# Patient Record
Sex: Male | Born: 1960 | Race: White | Hispanic: No | Marital: Married | Smoking: Light tobacco smoker
Health system: Southern US, Community
[De-identification: ages and names within clinical notes are randomized; demographics above are authoritative.]

## PROBLEM LIST (undated history)

## (undated) DIAGNOSIS — G709 Myoneural disorder, unspecified: Secondary | ICD-10-CM

## (undated) DIAGNOSIS — R200 Anesthesia of skin: Secondary | ICD-10-CM

## (undated) DIAGNOSIS — I10 Essential (primary) hypertension: Secondary | ICD-10-CM

## (undated) DIAGNOSIS — K219 Gastro-esophageal reflux disease without esophagitis: Secondary | ICD-10-CM

## (undated) DIAGNOSIS — R569 Unspecified convulsions: Secondary | ICD-10-CM

## (undated) DIAGNOSIS — E7800 Pure hypercholesterolemia, unspecified: Secondary | ICD-10-CM

## (undated) DIAGNOSIS — R202 Paresthesia of skin: Secondary | ICD-10-CM

## (undated) DIAGNOSIS — J45909 Unspecified asthma, uncomplicated: Secondary | ICD-10-CM

## (undated) DIAGNOSIS — M1990 Unspecified osteoarthritis, unspecified site: Secondary | ICD-10-CM

## (undated) HISTORY — PX: COLONOSCOPY: SHX174

---

## 2013-09-22 ENCOUNTER — Other Ambulatory Visit: Payer: Self-pay | Admitting: Neurosurgery

## 2013-10-24 ENCOUNTER — Encounter (HOSPITAL_COMMUNITY): Payer: Self-pay | Admitting: Pharmacy Technician

## 2013-10-30 ENCOUNTER — Other Ambulatory Visit (HOSPITAL_COMMUNITY): Payer: Self-pay

## 2013-10-31 ENCOUNTER — Encounter (HOSPITAL_COMMUNITY): Payer: Self-pay

## 2013-10-31 ENCOUNTER — Encounter (HOSPITAL_COMMUNITY)
Admission: RE | Admit: 2013-10-31 | Discharge: 2013-10-31 | Disposition: A | Discharge status: Home / Self Care | Payer: BC Managed Care – PPO | Source: Ambulatory Visit | Attending: Neurosurgery | Admitting: Neurosurgery

## 2013-10-31 ENCOUNTER — Encounter (INDEPENDENT_AMBULATORY_CARE_PROVIDER_SITE_OTHER): Payer: Self-pay

## 2013-10-31 ENCOUNTER — Ambulatory Visit (HOSPITAL_COMMUNITY)
Admission: RE | Admit: 2013-10-31 | Discharge: 2013-10-31 | Disposition: A | Discharge status: Home / Self Care | Payer: BC Managed Care – PPO | Source: Ambulatory Visit | Attending: Anesthesiology | Admitting: Anesthesiology

## 2013-10-31 ENCOUNTER — Other Ambulatory Visit: Payer: Self-pay

## 2013-10-31 DIAGNOSIS — Z01818 Encounter for other preprocedural examination: Secondary | ICD-10-CM | POA: Insufficient documentation

## 2013-10-31 HISTORY — DX: Essential (primary) hypertension: I10

## 2013-10-31 HISTORY — DX: Gastro-esophageal reflux disease without esophagitis: K21.9

## 2013-10-31 HISTORY — DX: Unspecified convulsions: R56.9

## 2013-10-31 HISTORY — DX: Unspecified osteoarthritis, unspecified site: M19.90

## 2013-10-31 HISTORY — DX: Pure hypercholesterolemia, unspecified: E78.00

## 2013-10-31 HISTORY — DX: Paresthesia of skin: R20.2

## 2013-10-31 HISTORY — DX: Unspecified asthma, uncomplicated: J45.909

## 2013-10-31 HISTORY — DX: Anesthesia of skin: R20.0

## 2013-10-31 LAB — BASIC METABOLIC PANEL
Anion gap: 12 (ref 5–15)
BUN: 16 mg/dL (ref 6–23)
CO2: 24 meq/L (ref 19–32)
Calcium: 9.1 mg/dL (ref 8.4–10.5)
Chloride: 103 mEq/L (ref 96–112)
Creatinine, Ser: 0.73 mg/dL (ref 0.50–1.35)
GFR calc Af Amer: >90 mL/min (ref >90)
GFR calc non Af Amer: >90 mL/min (ref >90)
GLUCOSE: 151 mg/dL — AB (ref 70–99)
POTASSIUM: 4.9 meq/L (ref 3.7–5.3)
Sodium: 139 mEq/L (ref 137–147)

## 2013-10-31 LAB — CBC
HEMATOCRIT: 41 % (ref 39.0–52.0)
HEMOGLOBIN: 13.8 g/dL (ref 13.0–17.0)
MCH: 30.9 pg (ref 26.0–34.0)
MCHC: 33.7 g/dL (ref 30.0–36.0)
MCV: 91.9 fL (ref 78.0–100.0)
PLATELETS: 249 10*3/uL (ref 150–400)
RBC: 4.46 MIL/uL (ref 4.22–5.81)
RDW: 12.9 % (ref 11.5–15.5)
WBC: 7 10*3/uL (ref 4.0–10.5)

## 2013-10-31 LAB — TYPE AND SCREEN
ABO/RH(D): A POS
Antibody Screen: NEGATIVE

## 2013-10-31 LAB — ABO/RH: ABO/RH(D): A POS

## 2013-10-31 LAB — SURGICAL PCR SCREEN
MRSA, PCR: NEGATIVE
Staphylococcus aureus: NEGATIVE

## 2013-10-31 NOTE — Pre-Procedure Instructions (Signed)
Cody Park  10/31/2013   Your procedure is scheduled on: Tuesday, November 04, 2013 at 9:00 AM  [facility] to [facility] Admitting at 6:00 AM (per MD)  Call this number if you have problems the morning of surgery: [phone]   Remember:   Do not eat food or drink liquids after midnight Monday, November 03, 2013   Take these medicines the morning of surgery with A SIP OF WATER: carbamazepine (TEGRETOL),   fexofenadine (ALLEGRA), if needed: pain medication Stop taking Aspirin, vitamins, and herbal medications. Do not take any NSAIDs ie: Ibuprofen, Advil, Naproxen or any medication containing Aspirin.  Do not wear jewelry, make-up or nail polish.  Do not wear lotions, powders, or perfumes. You may wear deodorant.  Do not shave 48 hours prior to surgery. Men may shave face and neck.  Do not bring valuables to the hospital.  [facility] is not responsible for any belongings or valuables.               Contacts, dentures or bridgework may not be worn into surgery.  Leave suitcase in the car. After surgery it may be brought to your room.  For patients admitted to the hospital, discharge time is determined by your treatment team.               Patients discharged the day of surgery will not be allowed to drive home.  Name and phone number of your driver:   Special Instructions:  Special Instructions:Special Instructions: [facility] - Preparing for Surgery  Before surgery, you can play an important role.  Because skin is not sterile, your skin needs to be as free of germs as possible.  You can reduce the number of germs on you skin by washing with CHG (chlorahexidine gluconate) soap before surgery.  CHG is an antiseptic cleaner which kills germs and bonds with the skin to continue killing germs even after washing.  Please DO NOT use if you have an allergy to CHG or antibacterial soaps.  If your skin becomes reddened/irritated stop using the CHG and inform your nurse when you arrive at  Short Stay.  Do not shave (including legs and underarms) for at least 48 hours prior to the first CHG shower.  You may shave your face.  Please follow these instructions carefully:   1.  Shower with CHG Soap the night before surgery and the morning of Surgery.  2.  If you choose to wash your hair, wash your hair first as usual with your normal shampoo.  3.  After you shampoo, rinse your hair and body thoroughly to remove the Shampoo.  4.  Use CHG as you would any other liquid soap.  You can apply chg directly  to the skin and wash gently with scrungie or a clean washcloth.  5.  Apply the CHG Soap to your body ONLY FROM THE NECK DOWN.  Do not use on open wounds or open sores.  Avoid contact with your eyes, ears, mouth and genitals (private parts).  Wash genitals (private parts) with your normal soap.  6.  Wash thoroughly, paying special attention to the area where your surgery will be performed.  7.  Thoroughly rinse your body with warm water from the neck down.  8.  DO NOT shower/wash with your normal soap after using and rinsing off the CHG Soap.  9.  Pat yourself dry with a clean towel.  10.  Wear clean pajamas.            11.  Place clean sheets on your bed the night of your first shower and do not sleep with pets.  Day of Surgery  Do not apply any lotions the morning of surgery.  Please wear clean clothes to the hospital/surgery center.   Please read over the following fact sheets that you were given: Pain Booklet, Coughing and Deep Breathing, Blood Transfusion Information, MRSA Information and Surgical Site Infection Prevention

## 2013-10-31 NOTE — Progress Notes (Signed)
Pt denies SOB, chest pain, and being under the care of a cardiologist. Pt denies having a chest x ray and EKG within the last year; pt denies having a stress test, echo, and cardiac cath. Pt stated that his last seizure was in 1991 when MD discontinued his seizure medicine.Pt has not had a seizure since going back on seizure medications. [place]  ( [place]) to review abnormal EKG.

## 2013-10-31 NOTE — Progress Notes (Signed)
10/31/13 1543  OBSTRUCTIVE SLEEP APNEA  Have you ever been diagnosed with sleep apnea through a sleep study? No  Do you snore loudly (loud enough to be heard through closed doors)?  0  Do you often feel tired, fatigued, or sleepy during the daytime? 0  Has anyone observed you stop breathing during your sleep? 0  Do you have, or are you being treated for high blood pressure? 1  BMI more than 35 kg/m2? 1  Age over 51 years old? 1  Neck circumference greater than 40 cm/16 inches? 1  Gender: 1  Obstructive Sleep Apnea Score 5

## 2013-11-03 ENCOUNTER — Telehealth: Payer: Self-pay

## 2013-11-03 ENCOUNTER — Encounter: Payer: Self-pay | Admitting: Cardiovascular Disease

## 2013-11-03 ENCOUNTER — Ambulatory Visit (INDEPENDENT_AMBULATORY_CARE_PROVIDER_SITE_OTHER): Payer: BC Managed Care – PPO | Admitting: Cardiovascular Disease

## 2013-11-03 ENCOUNTER — Ambulatory Visit (HOSPITAL_COMMUNITY)
Admission: RE | Admit: 2013-11-03 | Discharge: 2013-11-03 | Disposition: A | Discharge status: Home / Self Care | Payer: BC Managed Care – PPO | Source: Ambulatory Visit | Attending: Cardiovascular Disease | Admitting: Cardiovascular Disease

## 2013-11-03 VITALS — BP 122/78 | HR 99 | Ht 70.0 in | Wt 264.0 lb

## 2013-11-03 DIAGNOSIS — R9431 Abnormal electrocardiogram [ECG] [EKG]: Secondary | ICD-10-CM

## 2013-11-03 DIAGNOSIS — E785 Hyperlipidemia, unspecified: Secondary | ICD-10-CM

## 2013-11-03 DIAGNOSIS — I517 Cardiomegaly: Secondary | ICD-10-CM

## 2013-11-03 DIAGNOSIS — E669 Obesity, unspecified: Secondary | ICD-10-CM

## 2013-11-03 DIAGNOSIS — I10 Essential (primary) hypertension: Secondary | ICD-10-CM

## 2013-11-03 DIAGNOSIS — Z01818 Encounter for other preprocedural examination: Secondary | ICD-10-CM

## 2013-11-03 MED ORDER — DEXTROSE 5 % IV SOLN
3.0000 g | INTRAVENOUS | Status: AC
  Administered 2013-11-04: 3 g via INTRAVENOUS
  Filled 2013-11-03: qty 3000

## 2013-11-03 NOTE — Telephone Encounter (Signed)
Faxed echo report to Dr.Stern [phone]

## 2013-11-03 NOTE — Progress Notes (Addendum)
Patient ID: Cody Park, male   DOB: 02-Jun-1960, 53 y.o.   MRN: [id number]       CARDIOLOGY CONSULT NOTE  Patient ID: Cody Park MRN: [id number] DOB/AGE: 02-Jun-1960 53 y.o.  Admit date: (Not on file) Primary Physician Glenis SmokerJaswani, Sanjay M, MD  Reason for Consultation: abnormal ECG, preop risk stratification  HPI: The patient is a 53 yr old male with a PMH significant for seizures, essential HTN, obesity, and hyperlipidemia who is being scheduled to undergo L4-5 MAS, PLIF on 11/04/13 by Dr. Venetia MaxonStern. He is here with his wife, Bonita QuinLinda. He patient denies any symptoms of chest pain, palpitations, shortness of breath, lightheadedness, leg swelling, orthopnea, PND, and syncope. He has noticed some episodic dizziness after undergoing an epidural. His primary complaint is right leg weakness leading to diminished activity levels resulting in a 30 lb weight gain in the last several months. He smokes 2 cigars per year. He has no family history of premature CAD. 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 He has never undergone any noninvasive cardiovascular testing in the past.    Allergies  Allergen Reactions  . Codeine Other (See Comments)    Childhood allergy, reaction unknown.  . Shellfish Allergy Other (See Comments)    AGENT:  Only some shellfish.   REACTION: swelling    Current Outpatient Prescriptions  Medication Sig Dispense Refill  . acetaminophen (TYLENOL) 500 MG tablet Take 1,000 mg by mouth 2 (two) times daily as needed for mild pain.      Marland Kitchen. aspirin EC 81 MG tablet Take 81 mg by mouth daily.      . carbamazepine (TEGRETOL) 200 MG tablet Take 300 mg by mouth 2 (two) times daily.      . cyclobenzaprine (FLEXERIL) 10 MG tablet Take 10 mg by mouth daily as needed for muscle spasms.      Marland Kitchen. docusate sodium (COLACE) 100 MG capsule Take 100 mg by mouth every morning.      .  enalapril (VASOTEC) 5 MG tablet Take 5 mg by mouth daily.      . fexofenadine (ALLEGRA) 180 MG tablet Take 180 mg by mouth daily.      Marland Kitchen. HYDROcodone-acetaminophen (NORCO/VICODIN) 5-325 MG per tablet Take 1 tablet by mouth every 6 (six) hours as needed for moderate pain.      Marland Kitchen. ibuprofen (ADVIL,MOTRIN) 200 MG tablet Take 400 mg by mouth 2 (two) times daily as needed for mild pain.      Marland Kitchen. oxyCODONE-acetaminophen (PERCOCET/ROXICET) 5-325 MG per tablet Take 1 tablet by mouth every 6 (six) hours as needed for severe pain.      . pravastatin (PRAVACHOL) 20 MG tablet Take 20 mg by mouth daily.       No current facility-administered medications for this visit.   Facility-Administered Medications Ordered in Other Visits  Medication Dose Route Frequency Provider Last Rate Last Dose  . [START ON 11/04/2013] ceFAZolin (ANCEF) 3 g in dextrose 5 % 50 mL IVPB  3 g Intravenous On Call to OR Maeola HarmanJoseph Stern, MD        Past Medical History  Diagnosis Date  . Hypertension   . Hypercholesterolemia   . Asthma     as a child  . Seizures   . Numbness and tingling of right leg   . GERD (gastroesophageal reflux disease)   . Arthritis     Past Surgical History  Procedure Laterality Date  .  Colonoscopy      History   Social History  . Marital Status: Married    Spouse Name: N/A    Number of Children: N/A  . Years of Education: N/A   Occupational History  . Not on file.   Social History Main Topics  . Smoking status: Light Tobacco Smoker    Types: Cigars  . Smokeless tobacco: Never Used  . Alcohol Use: Yes     Comment: social ' beer mostly"  . Drug Use: No  . Sexual Activity: Not on file   Other Topics Concern  . Not on file   Social History Narrative  . No narrative on file     No family history of premature CAD in 1st degree relatives.  Prior to Admission medications   Medication Sig Start Date End Date Taking? Authorizing Provider  acetaminophen (TYLENOL) 500 MG tablet Take 1,000 mg  by mouth 2 (two) times daily as needed for mild pain.   Yes Historical Provider, MD  aspirin EC 81 MG tablet Take 81 mg by mouth daily.   Yes Historical Provider, MD  carbamazepine (TEGRETOL) 200 MG tablet Take 300 mg by mouth 2 (two) times daily.   Yes Historical Provider, MD  cyclobenzaprine (FLEXERIL) 10 MG tablet Take 10 mg by mouth daily as needed for muscle spasms.   Yes Historical Provider, MD  docusate sodium (COLACE) 100 MG capsule Take 100 mg by mouth every morning.   Yes Historical Provider, MD  enalapril (VASOTEC) 5 MG tablet Take 5 mg by mouth daily.   Yes Historical Provider, MD  fexofenadine (ALLEGRA) 180 MG tablet Take 180 mg by mouth daily.   Yes Historical Provider, MD  HYDROcodone-acetaminophen (NORCO/VICODIN) 5-325 MG per tablet Take 1 tablet by mouth every 6 (six) hours as needed for moderate pain.   Yes Historical Provider, MD  ibuprofen (ADVIL,MOTRIN) 200 MG tablet Take 400 mg by mouth 2 (two) times daily as needed for mild pain.   Yes Historical Provider, MD  oxyCODONE-acetaminophen (PERCOCET/ROXICET) 5-325 MG per tablet Take 1 tablet by mouth every 6 (six) hours as needed for severe pain.   Yes Historical Provider, MD  pravastatin (PRAVACHOL) 20 MG tablet Take 20 mg by mouth daily.   Yes Historical Provider, MD     Review of systems complete and found to be negative unless listed above in HPI     Physical exam Blood pressure 122/78, pulse 99, height 5\' 10"  (1.778 m), weight 264 lb (119.75 kg), SpO2 95.00%. General: NAD, obese Neck: No JVD, no thyromegaly or thyroid nodule.  Lungs: Clear to auscultation bilaterally with normal respiratory effort. CV: Nondisplaced PMI. Regular rate and rhythm, normal S1/S2, no S3/S4, no murmur.  No peripheral edema.  No carotid bruit.  Normal pedal pulses.  Abdomen: Soft, nontender, obese, no distention.  Skin: Intact without lesions or rashes.  Neurologic: Alert and oriented x 3.  Psych: Normal affect. Extremities: No clubbing or  cyanosis.  HEENT: Normal.   Labs:   Lab Results  Component Value Date   WBC 7.0 10/31/2013   HGB 13.8 10/31/2013   HCT 41.0 10/31/2013   MCV 91.9 10/31/2013   PLT 249 10/31/2013    Recent Labs Lab 10/31/13 1604  NA 139  K 4.9  CL 103  CO2 24  BUN 16  CREATININE 0.73  CALCIUM 9.1  GLUCOSE 151*   No results found for this basename: CKTOTAL, CKMB, CKMBINDEX, TROPONINI    No results found for this basename: CHOL  No results found for this basename: HDL   No results found for this basename: LDLCALC   No results found for this basename: TRIG   No results found for this basename: CHOLHDL   No results found for this basename: LDLDIRECT         Studies: No results found.  ASSESSMENT AND PLAN:  1. Preoperative risk stratification/abnormal ECG: The patient denies any symptoms of cardiovascular disease, specifically chest pain, palpitations, and exertional dyspnea. His abnormal ECG may be demonstrating Q waves which are falsely positive. He does have risk factors for coronary artery disease which include essential HTN, hyperlipidemia, and obesity. I will obtain an echocardiogram to assess left ventricular systolic function and regional wall motion, paying particular attention to the inferior wall. I do not feel he necessitates preoperative stress testing given his absence of symptoms. If the echo is normal, I think he would be at low risk for a major adverse perioperative cardiac event.  2. Essential hypertension: Controlled on enalapril.  3. Hyperlipidemia: On pravastatin.  Dispo: f/u prn.   Signed: Prentice Docker, M.D., F.A.C.C.  11/03/2013, 3:13 PM  ADDENDUM: Upon preliminary review of the echocardiographic images, left ventricular systolic function is normal, estimated EF 60-65%, with mild concentric left ventricular hypertrophy and normal regional wall motion. Specifically, the inferior wall moves well. Full report to follow. He should be at a low perioperative  risk for a major adverse cardiac event. Optimal BP control should be emphasized.

## 2013-11-03 NOTE — Progress Notes (Signed)
*   Echocardiogram 2D Echocardiogram has been performed.  Auri Jahnke 11/03/2013, 4:28 PM

## 2013-11-03 NOTE — Progress Notes (Signed)
Anesthesia Chart Review:  Patient is a 53 year old male scheduled for L4-5 MAS, PLIF on 11/04/13 by Dr. Venetia MaxonStern.  History includes smoking, HTN, hypercholesterolemia, GERD, arthritis, childhood asthma, seizures (on Tegretol), RLE paresthesias. BMI is 38 consistent with obesity.  OSA screening score was 5. PCP is listed as Dr. Donnetta HutchingSanjay Jaswani.  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 There is no comparison EKG in [facility], [facility], [facility], or at his PCP office.  Preoperative chest x-ray and labs noted.  Earlier today I reviewed above with anesthesiologist Dr. Noreene LarssonJoslin who recommended a preoperative cardiology evaluation due to inferior Q waves with CAD risk factors.  Dr. Purvis SheffieldKoneswaran with [facility] was able to work him in this afternoon. According to his note: "1. Preoperative risk stratification/abnormal ECG: The patient denies any symptoms of cardiovascular disease, specifically chest pain, palpitations, and exertional dyspnea. His abnormal ECG may be demonstrating Q waves which are falsely positive. He does have risk factors for coronary artery disease which include essential HTN, hyperlipidemia, and obesity. I will obtain an echocardiogram to assess left ventricular systolic function and regional wall motion, paying particular attention to the inferior wall. I do not feel he necessitates preoperative stress testing given his absence of symptoms. If the echo is normal, I think he would be at low risk for a major adverse perioperative cardiac event." PRN cardiology follow-up recommended.  Echo today showed:  - Procedure narrative: Transthoracic echocardiography. Image quality was suboptimal. The study was technically difficult, as a result of poor sound wave transmission and body habitus. - Left ventricle: The cavity size was normal. Wall thickness was increased in a pattern of mild LVH. Systolic function was vigorous. The estimated ejection fraction was in the range of 65%  to 70%. Wall motion was normal; there were no regional wall motion abnormalities. There was an increased relative contribution of atrial contraction to ventricular filling. Doppler parameters are consistent with abnormal left ventricular relaxation (grade 1 diastolic dysfunction). - Left atrium: The atrium was mildly dilated.  LVEF was NL with no regional wall motion abnormalities, so subsequently, I think he will be able to proceed as planned based on cardiology recommendations.  Velna Ochsllison Jaykob Minichiello, PA-C [facility]/Anesthesiology Phone [phone] 11/03/2013 5:51 PM

## 2013-11-03 NOTE — Patient Instructions (Addendum)
Your physician recommends that you schedule a follow-up appointment in: to be determined after echo     Your physician has requested that you have an echocardiogram NOW. Echocardiography is a painless test that uses sound waves to create images of your heart. It provides your doctor with information about the size and shape of your heart and how well your heart's chambers and valves are working. This procedure takes approximately one hour. There are no restrictions for this procedure.       Thank you for choosing [facility] !

## 2013-11-03 NOTE — Telephone Encounter (Signed)
[facility]Stern at [facility] [phone] preop risk stratification/cardiac consultation note from today

## 2013-11-04 ENCOUNTER — Encounter (HOSPITAL_COMMUNITY): Payer: Self-pay | Admitting: *Deleted

## 2013-11-04 ENCOUNTER — Inpatient Hospital Stay (HOSPITAL_COMMUNITY)
Admission: RE | Admit: 2013-11-04 | Discharge: 2013-11-05 | DRG: 460 | Disposition: A | Discharge status: Home / Self Care | Payer: BC Managed Care – PPO | Source: Ambulatory Visit | Attending: Neurosurgery | Admitting: Neurosurgery

## 2013-11-04 ENCOUNTER — Encounter (HOSPITAL_COMMUNITY): Payer: BC Managed Care – PPO | Admitting: Vascular Surgery

## 2013-11-04 ENCOUNTER — Inpatient Hospital Stay (HOSPITAL_COMMUNITY): Payer: BC Managed Care – PPO | Admitting: Anesthesiology

## 2013-11-04 ENCOUNTER — Encounter (HOSPITAL_COMMUNITY)
Admission: RE | Disposition: A | Discharge status: Home / Self Care | Payer: Self-pay | Source: Ambulatory Visit | Attending: Neurosurgery

## 2013-11-04 ENCOUNTER — Inpatient Hospital Stay (HOSPITAL_COMMUNITY): Payer: BC Managed Care – PPO

## 2013-11-04 DIAGNOSIS — M48061 Spinal stenosis, lumbar region without neurogenic claudication: Principal | ICD-10-CM | POA: Diagnosis present

## 2013-11-04 DIAGNOSIS — M4316 Spondylolisthesis, lumbar region: Secondary | ICD-10-CM | POA: Diagnosis present

## 2013-11-04 DIAGNOSIS — I10 Essential (primary) hypertension: Secondary | ICD-10-CM | POA: Diagnosis present

## 2013-11-04 DIAGNOSIS — M7130 Other bursal cyst, unspecified site: Secondary | ICD-10-CM | POA: Diagnosis present

## 2013-11-04 HISTORY — PX: MAXIMUM ACCESS (MAS)POSTERIOR LUMBAR INTERBODY FUSION (PLIF) 1 LEVEL: SHX6368

## 2013-11-04 SURGERY — FOR MAXIMUM ACCESS (MAS) POSTERIOR LUMBAR INTERBODY FUSION (PLIF) 1 LEVEL
Anesthesia: General | Site: Back

## 2013-11-04 MED ORDER — DOCUSATE SODIUM 100 MG PO CAPS
100.0000 mg | ORAL_CAPSULE | Freq: Every day | ORAL | Status: DC
  Filled 2013-11-04: qty 1

## 2013-11-04 MED ORDER — ACETAMINOPHEN 650 MG RE SUPP: 650.0000 mg | RECTAL | Status: DC | PRN

## 2013-11-04 MED ORDER — LACTATED RINGERS IV SOLN
INTRAVENOUS | Status: DC
  Administered 2013-11-04 (×2): via INTRAVENOUS

## 2013-11-04 MED ORDER — HYDROMORPHONE HCL PF 1 MG/ML IJ SOLN
0.2500 mg | INTRAMUSCULAR | Status: DC | PRN
  Administered 2013-11-04 (×3): 0.5 mg via INTRAVENOUS

## 2013-11-04 MED ORDER — ACETAMINOPHEN 325 MG PO TABS: 650.0000 mg | ORAL_TABLET | ORAL | Status: DC | PRN

## 2013-11-04 MED ORDER — ROCURONIUM BROMIDE 50 MG/5ML IV SOLN
INTRAVENOUS | Status: AC
  Filled 2013-11-04: qty 1

## 2013-11-04 MED ORDER — ONDANSETRON HCL 4 MG/2ML IJ SOLN: 4.0000 mg | Freq: Four times a day (QID) | INTRAMUSCULAR | Status: DC | PRN

## 2013-11-04 MED ORDER — OXYCODONE HCL 5 MG/5ML PO SOLN: 5.0000 mg | Freq: Once | ORAL | Status: AC | PRN

## 2013-11-04 MED ORDER — FENTANYL CITRATE 0.05 MG/ML IJ SOLN
INTRAMUSCULAR | Status: DC | PRN
  Administered 2013-11-04: 150 ug via INTRAVENOUS
  Administered 2013-11-04 (×2): 50 ug via INTRAVENOUS

## 2013-11-04 MED ORDER — ENALAPRIL MALEATE 5 MG PO TABS
5.0000 mg | ORAL_TABLET | Freq: Every day | ORAL | Status: DC
  Administered 2013-11-04 – 2013-11-05 (×2): 5 mg via ORAL
  Filled 2013-11-04 (×2): qty 1

## 2013-11-04 MED ORDER — OXYCODONE-ACETAMINOPHEN 5-325 MG PO TABS: 1.0000 | ORAL_TABLET | Freq: Four times a day (QID) | ORAL | Status: DC | PRN

## 2013-11-04 MED ORDER — MIDAZOLAM HCL 5 MG/5ML IJ SOLN
INTRAMUSCULAR | Status: DC | PRN
  Administered 2013-11-04: 2 mg via INTRAVENOUS

## 2013-11-04 MED ORDER — MORPHINE SULFATE 2 MG/ML IJ SOLN: 1.0000 mg | INTRAMUSCULAR | Status: DC | PRN

## 2013-11-04 MED ORDER — ONDANSETRON HCL 4 MG/2ML IJ SOLN: 4.0000 mg | INTRAMUSCULAR | Status: DC | PRN

## 2013-11-04 MED ORDER — ZOLPIDEM TARTRATE 5 MG PO TABS: 5.0000 mg | ORAL_TABLET | Freq: Every evening | ORAL | Status: DC | PRN

## 2013-11-04 MED ORDER — OXYCODONE HCL 5 MG PO TABS
5.0000 mg | ORAL_TABLET | Freq: Once | ORAL | Status: AC | PRN
  Administered 2013-11-04: 5 mg via ORAL

## 2013-11-04 MED ORDER — SODIUM CHLORIDE 0.9 % IJ SOLN
3.0000 mL | Freq: Two times a day (BID) | INTRAMUSCULAR | Status: DC
  Administered 2013-11-04 – 2013-11-05 (×3): 3 mL via INTRAVENOUS

## 2013-11-04 MED ORDER — OXYCODONE-ACETAMINOPHEN 5-325 MG PO TABS
1.0000 | ORAL_TABLET | ORAL | Status: DC | PRN
Start: 2013-11-04 — End: 2013-11-05
  Administered 2013-11-04 – 2013-11-05 (×5): 2 via ORAL
  Filled 2013-11-04 (×5): qty 2

## 2013-11-04 MED ORDER — DOCUSATE SODIUM 100 MG PO CAPS
100.0000 mg | ORAL_CAPSULE | Freq: Two times a day (BID) | ORAL | Status: DC
  Administered 2013-11-04 – 2013-11-05 (×3): 100 mg via ORAL
  Filled 2013-11-04 (×3): qty 1

## 2013-11-04 MED ORDER — DIAZEPAM 5 MG PO TABS
5.0000 mg | ORAL_TABLET | Freq: Four times a day (QID) | ORAL | Status: DC | PRN
  Administered 2013-11-04 – 2013-11-05 (×2): 5 mg via ORAL
  Filled 2013-11-04: qty 1

## 2013-11-04 MED ORDER — LIDOCAINE HCL (CARDIAC) 20 MG/ML IV SOLN
INTRAVENOUS | Status: DC | PRN
  Administered 2013-11-04: 80 mg via INTRAVENOUS

## 2013-11-04 MED ORDER — HYDROMORPHONE HCL PF 1 MG/ML IJ SOLN
INTRAMUSCULAR | Status: AC
  Filled 2013-11-04: qty 1

## 2013-11-04 MED ORDER — BUPIVACAINE LIPOSOME 1.3 % IJ SUSP
20.0000 mL | INTRAMUSCULAR | Status: AC
  Administered 2013-11-04: 20 mL
  Filled 2013-11-04: qty 20

## 2013-11-04 MED ORDER — OXYCODONE HCL 5 MG PO TABS
ORAL_TABLET | ORAL | Status: AC
  Filled 2013-11-04: qty 1

## 2013-11-04 MED ORDER — HYDROCODONE-ACETAMINOPHEN 5-325 MG PO TABS: 1.0000 | ORAL_TABLET | Freq: Four times a day (QID) | ORAL | Status: DC | PRN

## 2013-11-04 MED ORDER — PROPOFOL 10 MG/ML IV BOLUS
INTRAVENOUS | Status: AC
  Filled 2013-11-04: qty 20

## 2013-11-04 MED ORDER — ASPIRIN EC 81 MG PO TBEC
81.0000 mg | DELAYED_RELEASE_TABLET | Freq: Every day | ORAL | Status: DC
  Administered 2013-11-04 – 2013-11-05 (×2): 81 mg via ORAL
  Filled 2013-11-04 (×2): qty 1

## 2013-11-04 MED ORDER — DEXTROSE 5 % IV SOLN: 3.0000 g | Freq: Three times a day (TID) | INTRAVENOUS | Status: DC

## 2013-11-04 MED ORDER — PROPOFOL 10 MG/ML IV BOLUS
INTRAVENOUS | Status: DC | PRN
  Administered 2013-11-04: 200 mg via INTRAVENOUS

## 2013-11-04 MED ORDER — KCL IN DEXTROSE-NACL 20-5-0.45 MEQ/L-%-% IV SOLN
INTRAVENOUS | Status: DC
  Filled 2013-11-04 (×3): qty 1000

## 2013-11-04 MED ORDER — PROPOFOL INFUSION 10 MG/ML OPTIME
INTRAVENOUS | Status: DC | PRN
  Administered 2013-11-04: 75 ug/kg/min via INTRAVENOUS

## 2013-11-04 MED ORDER — PANTOPRAZOLE SODIUM 40 MG IV SOLR
40.0000 mg | Freq: Every day | INTRAVENOUS | Status: DC
  Filled 2013-11-04 (×2): qty 40

## 2013-11-04 MED ORDER — POLYETHYLENE GLYCOL 3350 17 G PO PACK
17.0000 g | PACK | Freq: Every day | ORAL | Status: DC | PRN
  Filled 2013-11-04: qty 1

## 2013-11-04 MED ORDER — CEFAZOLIN SODIUM-DEXTROSE 2-3 GM-% IV SOLR
2.0000 g | Freq: Three times a day (TID) | INTRAVENOUS | Status: AC
  Administered 2013-11-04 – 2013-11-05 (×2): 2 g via INTRAVENOUS
  Filled 2013-11-04 (×2): qty 50

## 2013-11-04 MED ORDER — BISACODYL 10 MG RE SUPP: 10.0000 mg | Freq: Every day | RECTAL | Status: DC | PRN

## 2013-11-04 MED ORDER — PHENYLEPHRINE HCL 10 MG/ML IJ SOLN
INTRAMUSCULAR | Status: DC | PRN
  Administered 2013-11-04: 60 ug via INTRAVENOUS
  Administered 2013-11-04: 40 ug via INTRAVENOUS

## 2013-11-04 MED ORDER — LORATADINE 10 MG PO TABS
10.0000 mg | ORAL_TABLET | Freq: Every day | ORAL | Status: DC
  Administered 2013-11-04 – 2013-11-05 (×2): 10 mg via ORAL
  Filled 2013-11-04 (×2): qty 1

## 2013-11-04 MED ORDER — PHENYLEPHRINE 40 MCG/ML (10ML) SYRINGE FOR IV PUSH (FOR BLOOD PRESSURE SUPPORT)
PREFILLED_SYRINGE | INTRAVENOUS | Status: AC
  Filled 2013-11-04: qty 10

## 2013-11-04 MED ORDER — FENTANYL CITRATE 0.05 MG/ML IJ SOLN
INTRAMUSCULAR | Status: AC
  Filled 2013-11-04: qty 5

## 2013-11-04 MED ORDER — SENNA 8.6 MG PO TABS
1.0000 | ORAL_TABLET | Freq: Two times a day (BID) | ORAL | Status: DC
  Administered 2013-11-04 – 2013-11-05 (×2): 8.6 mg via ORAL
  Filled 2013-11-04 (×3): qty 1

## 2013-11-04 MED ORDER — FLEET ENEMA 7-19 GM/118ML RE ENEM
1.0000 | ENEMA | Freq: Once | RECTAL | Status: AC | PRN
  Filled 2013-11-04: qty 1

## 2013-11-04 MED ORDER — CARBAMAZEPINE 200 MG PO TABS
300.0000 mg | ORAL_TABLET | Freq: Two times a day (BID) | ORAL | Status: DC
  Administered 2013-11-04 – 2013-11-05 (×2): 300 mg via ORAL
  Filled 2013-11-04 (×3): qty 1.5

## 2013-11-04 MED ORDER — SIMVASTATIN 5 MG PO TABS
5.0000 mg | ORAL_TABLET | Freq: Every day | ORAL | Status: DC
  Administered 2013-11-04: 5 mg via ORAL
  Filled 2013-11-04 (×2): qty 1

## 2013-11-04 MED ORDER — CYCLOBENZAPRINE HCL 10 MG PO TABS
10.0000 mg | ORAL_TABLET | Freq: Every day | ORAL | Status: DC | PRN
  Administered 2013-11-04: 10 mg via ORAL
  Filled 2013-11-04 (×2): qty 1

## 2013-11-04 MED ORDER — DIAZEPAM 5 MG PO TABS
ORAL_TABLET | ORAL | Status: AC
  Filled 2013-11-04: qty 1

## 2013-11-04 MED ORDER — ONDANSETRON HCL 4 MG/2ML IJ SOLN
INTRAMUSCULAR | Status: AC
  Filled 2013-11-04: qty 2

## 2013-11-04 MED ORDER — ALUM & MAG HYDROXIDE-SIMETH 200-200-20 MG/5ML PO SUSP: 30.0000 mL | Freq: Four times a day (QID) | ORAL | Status: DC | PRN

## 2013-11-04 MED ORDER — ACETAMINOPHEN 500 MG PO TABS: 1000.0000 mg | ORAL_TABLET | Freq: Two times a day (BID) | ORAL | Status: DC | PRN

## 2013-11-04 MED ORDER — MIDAZOLAM HCL 2 MG/2ML IJ SOLN
INTRAMUSCULAR | Status: AC
  Filled 2013-11-04: qty 2

## 2013-11-04 MED ORDER — SUCCINYLCHOLINE CHLORIDE 20 MG/ML IJ SOLN
INTRAMUSCULAR | Status: DC | PRN
  Administered 2013-11-04: 140 mg via INTRAVENOUS

## 2013-11-04 MED ORDER — 0.9 % SODIUM CHLORIDE (POUR BTL) OPTIME
TOPICAL | Status: DC | PRN
  Administered 2013-11-04: 1000 mL

## 2013-11-04 MED ORDER — HYDROCODONE-ACETAMINOPHEN 5-325 MG PO TABS: 1.0000 | ORAL_TABLET | ORAL | Status: DC | PRN

## 2013-11-04 MED ORDER — SODIUM CHLORIDE 0.9 % IJ SOLN: 3.0000 mL | INTRAMUSCULAR | Status: DC | PRN

## 2013-11-04 MED ORDER — MENTHOL 3 MG MT LOZG: 1.0000 | LOZENGE | OROMUCOSAL | Status: DC | PRN

## 2013-11-04 MED ORDER — THROMBIN 20000 UNITS EX SOLR
CUTANEOUS | Status: DC | PRN
  Administered 2013-11-04: 11:00:00 via TOPICAL

## 2013-11-04 MED ORDER — LIDOCAINE HCL (CARDIAC) 20 MG/ML IV SOLN
INTRAVENOUS | Status: AC
  Filled 2013-11-04: qty 5

## 2013-11-04 MED ORDER — PHENOL 1.4 % MT LIQD: 1.0000 | OROMUCOSAL | Status: DC | PRN

## 2013-11-04 SURGICAL SUPPLY — 86 items
BAG DECANTER FOR FLEXI CONT (MISCELLANEOUS) ×3 IMPLANT
BENZOIN TINCTURE PRP APPL 2/3 (GAUZE/BANDAGES/DRESSINGS) ×3 IMPLANT
BLADE SURG ROTATE 9660 (MISCELLANEOUS) IMPLANT
BONE MATRIX OSTEOCEL PRO MED (Bone Implant) ×3 IMPLANT
BUR MATCHSTICK NEURO 3.0 LAGG (BURR) ×3 IMPLANT
CAGE COROENT LC 10X9X25 (Cage) ×2 IMPLANT
CAGE COROENT LC 10X9X25MM (Cage) ×1 IMPLANT
CANISTER SUCT 3000ML (MISCELLANEOUS) ×3 IMPLANT
CLIP NEUROVISION LG (CLIP) ×3 IMPLANT
CLOSURE WOUND 1/2 X4 (GAUZE/BANDAGES/DRESSINGS) ×1
CONT SPEC 4OZ CLIKSEAL STRL BL (MISCELLANEOUS) ×6 IMPLANT
COVER BACK TABLE 24X17X13 BIG (DRAPES) IMPLANT
COVER TABLE BACK 60X90 (DRAPES) ×3 IMPLANT
DERMABOND ADHESIVE PROPEN (GAUZE/BANDAGES/DRESSINGS) ×2
DERMABOND ADVANCED (GAUZE/BANDAGES/DRESSINGS) ×2
DERMABOND ADVANCED .7 DNX12 (GAUZE/BANDAGES/DRESSINGS) ×1 IMPLANT
DERMABOND ADVANCED .7 DNX6 (GAUZE/BANDAGES/DRESSINGS) ×1 IMPLANT
DRAPE C-ARM 42X72 X-RAY (DRAPES) ×3 IMPLANT
DRAPE C-ARMOR (DRAPES) ×3 IMPLANT
DRAPE LAPAROTOMY 100X72X124 (DRAPES) ×3 IMPLANT
DRAPE POUCH INSTRU U-SHP 10X18 (DRAPES) ×3 IMPLANT
DRAPE SURG 17X23 STRL (DRAPES) ×3 IMPLANT
DRSG OPSITE POSTOP 3X4 (GAUZE/BANDAGES/DRESSINGS) ×3 IMPLANT
DRSG OPSITE POSTOP 4X6 (GAUZE/BANDAGES/DRESSINGS) ×3 IMPLANT
DRSG TELFA 3X8 NADH (GAUZE/BANDAGES/DRESSINGS) ×3 IMPLANT
DURAPREP 26ML APPLICATOR (WOUND CARE) ×3 IMPLANT
ELECT BLADE 6.5 EXT (BLADE) ×6 IMPLANT
ELECT REM PT RETURN 9FT ADLT (ELECTROSURGICAL) ×3
ELECTRODE REM PT RTRN 9FT ADLT (ELECTROSURGICAL) ×1 IMPLANT
EVACUATOR 1/8 PVC DRAIN (DRAIN) ×3 IMPLANT
GAUZE SPONGE 4X4 12PLY STRL (GAUZE/BANDAGES/DRESSINGS) ×3 IMPLANT
GAUZE SPONGE 4X4 16PLY XRAY LF (GAUZE/BANDAGES/DRESSINGS) IMPLANT
GLOVE BIO SURGEON STRL SZ8 (GLOVE) ×3 IMPLANT
GLOVE BIOGEL PI IND STRL 8 (GLOVE) ×1 IMPLANT
GLOVE BIOGEL PI IND STRL 8.5 (GLOVE) ×1 IMPLANT
GLOVE BIOGEL PI INDICATOR 8 (GLOVE) ×2
GLOVE BIOGEL PI INDICATOR 8.5 (GLOVE) ×2
GLOVE ECLIPSE 8.0 STRL XLNG CF (GLOVE) ×3 IMPLANT
GLOVE EXAM NITRILE LRG STRL (GLOVE) IMPLANT
GLOVE EXAM NITRILE MD LF STRL (GLOVE) IMPLANT
GLOVE EXAM NITRILE XL STR (GLOVE) IMPLANT
GLOVE EXAM NITRILE XS STR PU (GLOVE) IMPLANT
GOWN STRL REUS W/ TWL LRG LVL3 (GOWN DISPOSABLE) IMPLANT
GOWN STRL REUS W/ TWL XL LVL3 (GOWN DISPOSABLE) ×2 IMPLANT
GOWN STRL REUS W/TWL 2XL LVL3 (GOWN DISPOSABLE) ×6 IMPLANT
GOWN STRL REUS W/TWL LRG LVL3 (GOWN DISPOSABLE)
GOWN STRL REUS W/TWL XL LVL3 (GOWN DISPOSABLE) ×4
KIT BASIN OR (CUSTOM PROCEDURE TRAY) ×3 IMPLANT
KIT NEEDLE NVM5 EMG ELECT (KITS) ×1 IMPLANT
KIT NEEDLE NVM5 EMG ELECTRODE (KITS) ×2
KIT POSITION SURG JACKSON T1 (MISCELLANEOUS) ×3 IMPLANT
KIT ROOM TURNOVER OR (KITS) ×3 IMPLANT
MILL MEDIUM DISP (BLADE) ×3 IMPLANT
NEEDLE HYPO 21X1.5 SAFETY (NEEDLE) ×3 IMPLANT
NEEDLE HYPO 25X1 1.5 SAFETY (NEEDLE) ×3 IMPLANT
NEEDLE SPNL 18GX3.5 QUINCKE PK (NEEDLE) IMPLANT
NS IRRIG 1000ML POUR BTL (IV SOLUTION) ×3 IMPLANT
PACK LAMINECTOMY NEURO (CUSTOM PROCEDURE TRAY) ×3 IMPLANT
PAD ARMBOARD 7.5X6 YLW CONV (MISCELLANEOUS) ×9 IMPLANT
PATTIES SURGICAL .5 X.5 (GAUZE/BANDAGES/DRESSINGS) IMPLANT
PATTIES SURGICAL .5 X1 (DISPOSABLE) IMPLANT
PATTIES SURGICAL 1X1 (DISPOSABLE) IMPLANT
ROD 35MM (Rod) ×6 IMPLANT
SCREW LOCK (Screw) ×8 IMPLANT
SCREW LOCK FXNS SPNE MAS PL (Screw) ×4 IMPLANT
SCREW PLIF MAS 5.5X35 LUMBAR (Screw) ×6 IMPLANT
SCREW SHANK 5.5X30MM (Screw) ×3 IMPLANT
SCREW SHANKS 5.5X35 (Screw) ×3 IMPLANT
SCREW TULIP 5.5 (Screw) ×6 IMPLANT
SPONGE LAP 4X18 X RAY DECT (DISPOSABLE) IMPLANT
SPONGE SURGIFOAM ABS GEL 100 (HEMOSTASIS) ×3 IMPLANT
STAPLER SKIN PROX WIDE 3.9 (STAPLE) IMPLANT
STRIP CLOSURE SKIN 1/2X4 (GAUZE/BANDAGES/DRESSINGS) ×2 IMPLANT
SUT VIC AB 1 CT1 18XBRD ANBCTR (SUTURE) ×2 IMPLANT
SUT VIC AB 1 CT1 8-18 (SUTURE) ×4
SUT VIC AB 2-0 CT1 18 (SUTURE) ×6 IMPLANT
SUT VIC AB 3-0 SH 8-18 (SUTURE) ×6 IMPLANT
SYR 20CC LL (SYRINGE) ×3 IMPLANT
SYR 20ML ECCENTRIC (SYRINGE) ×3 IMPLANT
SYR 5ML LL (SYRINGE) IMPLANT
TAPE STRIPS DRAPE STRL (GAUZE/BANDAGES/DRESSINGS) ×3 IMPLANT
TOWEL OR 17X24 6PK STRL BLUE (TOWEL DISPOSABLE) ×3 IMPLANT
TOWEL OR 17X26 10 PK STRL BLUE (TOWEL DISPOSABLE) ×3 IMPLANT
TRAP SPECIMEN MUCOUS 40CC (MISCELLANEOUS) ×3 IMPLANT
TRAY FOLEY CATH 14FRSI W/METER (CATHETERS) IMPLANT
WATER STERILE IRR 1000ML POUR (IV SOLUTION) ×3 IMPLANT

## 2013-11-04 NOTE — Anesthesia Postprocedure Evaluation (Signed)
Anesthesia Post Note  Patient: Cody Park [name]  Procedure(s) Performed: Procedure(s) (LRB): FOR MAXIMUM ACCESS (MAS) POSTERIOR LUMBAR INTERBODY FUSION (PLIF) 1 LEVEL (N/A)  Anesthesia type: General  Patient location: PACU  Post pain: Pain level controlled and Adequate analgesia  Post assessment: Post-op Vital signs reviewed, Patient's Cardiovascular Status Stable, Respiratory Function Stable, Patent Airway and Pain level controlled  Last Vitals:  Filed Vitals:   11/04/13 1328  BP:   Pulse: 87  Temp:   Resp: 13    Post vital signs: Reviewed and stable  Level of consciousness: awake, alert  and oriented  Complications: No apparent anesthesia complications

## 2013-11-04 NOTE — Anesthesia Preprocedure Evaluation (Addendum)
Anesthesia Evaluation  Patient identified by MRN, date of birth, ID band Patient awake    Reviewed: Allergy & Precautions, H&P , NPO status , Patient's Chart, lab work & pertinent test results  Airway Mallampati: I  Neck ROM: full    Dental  (+) Teeth Intact   Pulmonary asthma , Current Smoker,          Cardiovascular hypertension, Pt. on medications     Neuro/Psych Seizures -,     GI/Hepatic GERD-  ,  Endo/Other  Morbid obesity  Renal/GU      Musculoskeletal  (+) Arthritis -,   Abdominal   Peds  Hematology   Anesthesia Other Findings   Reproductive/Obstetrics                         Anesthesia Physical Anesthesia Plan  ASA: II  Anesthesia Plan: General   Post-op Pain Management:    Induction: Intravenous  Airway Management Planned: Oral ETT  Additional Equipment:   Intra-op Plan:   Post-operative Plan: Extubation in OR  Informed Consent: I have reviewed the patients History and Physical, chart, labs and discussed the procedure including the risks, benefits and alternatives for the proposed anesthesia with the patient or authorized representative who has indicated his/her understanding and acceptance.   Dental advisory given  Plan Discussed with: Anesthesiologist, CRNA and Surgeon  Anesthesia Plan Comments:        Anesthesia Quick Evaluation

## 2013-11-04 NOTE — Progress Notes (Signed)
Patient ID: Cody Park, male   DOB: 10-12-1960, 53 y.o.   MRN: [id number] Alert, conversant. Incisional soreness, but no leg pain at present. Full strength BLE.   Georgiann CockerBrian Marget Outten RN BSN

## 2013-11-04 NOTE — Plan of Care (Signed)
Problem: Consults Goal: Diagnosis - Spinal Surgery Outcome: Completed/Met Date Met:  11/04/13 Thoraco/Lumbar Spine Fusion

## 2013-11-04 NOTE — Brief Op Note (Signed)
11/04/2013  12:38 PM  PATIENT:  Cody Park  53 y.o. 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, Lumbar stenosis, Lumbar radiculopathy L 45 level  PROCEDURE:  Procedure(s) with comments: FOR MAXIMUM ACCESS (MAS) POSTERIOR LUMBAR INTERBODY FUSION (PLIF) 1 LEVEL (N/A) - L4-5 maximum access posterior lumbar interbody fusion with PEEK interbody cage via TLIF approach with local bone autograft and allograft, pedicle screw fixation and posterolateral arthrodesis  Decompression greater than standard PLIF procedure  SURGEON:  Surgeon(s) and Role:    * Malaiyah Achorn, MD - Primary    * Henry Elsner, MD - Assisting  PHYSICIAN ASSISTANT:   ASSISTANTS: Poteat, RN   ANESTHESIA:   general  EBL:  Total I/O In: 1000 [I.V.:1000] Out: 450 [Urine:300; Blood:150]  BLOOD ADMINISTERED:none  DRAINS: none   LOCAL MEDICATIONS USED:  MARCAINE     SPECIMEN:  No Specimen  DISPOSITION OF SPECIMEN:  N/A  COUNTS:  YES  TOURNIQUET:  * No tourniquets in log *  DICTATION: Patient is a 53-year-old with spondylosis , stenosis, spondylolisthesis, disc herniation and severe back and right lower extremity pain at L4/5 levels of the lumbar spine. It was elected to take him to surgery for MASPLIF L 45 level with posterolateral arthrodesis with resection of synovial cyst.  Procedure:   Following uncomplicated induction of GETA, and placement of electrodes for neural monitoring, patient was turned into a prone position on the Jackson tableand using AP  fluoroscopy the area of planned incision was marked, prepped with betadine scrub and Duraprep, then draped. Exposure was performed of facet joint complex at L 45 level and the MAS retractor was placed.5.5 x 30 mm cortical Nuvasive screws were placed on the right at L 4 and 5.5 x 35 mm  screw on the left according to standard landmarks using neural monitoring.  A total laminectomy of L 4 was then performed with disarticulation of facets.  This bone was saved for grafting, combined with Osteocel after being run through bone mill and was placed in bone packing device.  Thorough discectomy was performed on the left at L 45 and the endplates were prepared for grafting.  The L 4 nerve root on the right was conjoined and exited the foramen at the level of the disc space.  A synovial cyst was removed which was compressing the take off of the right sided nerve root.  A 10 mm TLIF x 8  degree cage was placed in the interspace and positioning was confirmed with AP and lateral fluoroscopy from the left.  10 cc of autograft/Osteocel was packed in the interspace deep to the cage.   Remaining screws were placed at L 5 and 35 mm rods were placed.   And the screws were locked and torqued. Final Xrays showed well positioned implants and screw fixation. The posterolateral region was packed with remaining 10 cc of autograft on the left of midline. The wounds were irrigated and then closed with 1, 2-0 and 3-0 Vicryl stitches. 20 cc long-acting Marcaine was injected in the subcutaneous tissues. Sterile occlusive dressing was placed with Dermabond. The patient was then extubated in the operating room and taken to recovery in stable and satisfactory condition having tolerated his operation well. Counts were correct at the end of the case.  PLAN OF CARE: Admit to inpatient   PATIENT DISPOSITION:  PACU - hemodynamically stable.     Delay start of Pharmacological VTE agent (>24hrs) due to surgical blood loss or risk of bleeding: yes

## 2013-11-04 NOTE — H&P (Signed)
> [address] [place], [place] [address] Phone: [phone]   Patient ID:   [id number] Patient: Cody Park  Date of Birth: 28-Apr-1960 Visit Type: Office Visit   Date: 09/22/2013 09:00 AM Provider: Danae Orleans. Venetia Maxon MD   This 53 year old male presents for back pain and Leg pain.  History of Present Illness: 1.  back pain  2.  Leg pain  Cody Park, 53 y.o. male employed with [employer] in [place], visits reporting lumbar & RLE pain, numbness, and weakness since September.  He recalls no injury.  S/S increase with lifting & walking, decrease when home & resting.  PT, ESI, steroid taper offered no help.  He reports some dizziness with position changes since the [facility] on 09-10-13.  Oxycodone 5/325 1/day Norco 5/325 1-2/day Flexeril 10mg  3-4/week Ibuprofen prn  Hx: HTN, Seizures ('89, '91)  MRI, X-ray on [place]  Patient is tried physical therapy and says that he is worse with physical therapy.  He also had an injection by Dr. Myrle Sheng on 09/10/13 and said it gave him no relief whatsoever.  He has increased and weight 30 pounds over the last 6 months.  He says his leg bothers him much more than his back.  Currently describes right leg pain at 9 out of 10 at its worst and 5 out of 10 at its best and low back pain at 4 out of 10 at its worst and 0-10 at its best.  Patient notes tingling into his right foot and weakness into his right leg he denies any left leg complaints.  He says he is very frustrated with this pain and deterioration of his lifestyle.  He wants to get some relief.        PAST MEDICAL/SURGICAL HISTORY   (Detailed)  Disease/disorder Onset Date Management Date Comments  Asthma      High cholesterol      Hypertension      Seizure disorder          PAST MEDICAL HISTORY, SURGICAL HISTORY, FAMILY HISTORY, SOCIAL HISTORY AND REVIEW OF SYSTEMS I have reviewed the patient's past medical, surgical, family and social  history as well as the comprehensive review of systems as included on the [facility] history form dated 09/22/2013, which I have signed.  Family History  (Detailed)  Relationship Family Member Name Deceased Age at Death Condition Onset Age Cause of Death      Family history of Hypertension  N   SOCIAL HISTORY  (Detailed) Tobacco use reviewed. Preferred language is Unknown.   Smoking status: Never smoker.  SMOKING STATUS Use Status Type Smoking Status Usage Per Day Years Used Total Pack Years  no/never  Never smoker             MEDICATIONS(added, continued or stopped this visit):   Started Medication Directions Instruction Stopped   Allegra 180 mg tablet take 1 tablet by oral route  every day     aspirin 81 mg tablet,delayed release take 1 tablet by oral route  every day     enalapril maleate 5 mg tablet take 1 tablet by oral route  every day     Fish Oil 1 capsule daily     Flexeril 10 mg tablet take 1 tablet by oral route 3 times every day as needed    09/22/2013 Neurontin 100 mg capsule Take 1 capsule po TIDx3days, the 2 caps TIDx3days, then 3caps TID    09/22/2013 Norco 5  mg-325 mg tablet take 1 tablet by oral route  every 6 hours as needed for pain     Norco 5 mg-325 mg tablet take 1 tablet by oral route  every 6 hours as needed for pain  09/22/2013   Percocet 5 mg-325 mg tablet take 1 tablet by oral route 2 times every day as needed     pravastatin 20 mg tablet take 1 tablet by oral route  every day     Tegretol XR 100 mg tablet,extended release take 3 tablet by oral route  every 12 hours      ALLERGIES:  Ingredient Reaction Medication Name Comment  CODEINE Unknown    Reviewed, updated.  REVIEW OF SYSTEMS System Neg/Pos Details  Constitutional Negative Chills, fatigue, fever, malaise, night sweats, weight gain and weight loss.  ENMT Negative Ear drainage, hearing loss, nasal drainage, otalgia, sinus pressure and sore throat.  Eyes  Negative Eye discharge, eye pain and vision changes.  Respiratory Negative Chronic cough, cough, dyspnea, known TB exposure and wheezing.  Cardio Negative Chest pain, claudication, edema and irregular heartbeat/palpitations.  GI Negative Abdominal pain, blood in stool, change in stool pattern, constipation, decreased appetite, diarrhea, heartburn, nausea and vomiting.  GU Negative Dribbling, dysuria, erectile dysfunction, hematuria, polyuria, slow stream, urinary frequency, urinary incontinence and urinary retention.  Endocrine Negative Cold intolerance, heat intolerance, polydipsia and polyphagia.  Neuro Positive Numbness in extremity.  Psych Negative Anxiety, depression and insomnia.  Integumentary Negative Brittle hair, brittle nails, change in shape/size of mole(s), hair loss, hirsutism, hives, pruritus, rash and skin lesion.  MS Positive Back pain, RLE pain.  Hema/Lymph Negative Easy bleeding, easy bruising and lymphadenopathy.  Allergic/Immuno Negative Contact allergy, environmental allergies, food allergies and seasonal allergies.  Reproductive Negative Penile discharge and sexual dysfunction.    Vitals Date Temp F BP Pulse Ht In Wt Lb BMI BSA Pain Score  09/22/2013  141/87 84 70 264 37.88  4/10     PHYSICAL EXAM General Level of Distress: no acute distress Overall Appearance: obese    Cardiovascular Cardiac: regular rate and rhythm without murmur  Respiratory Lungs: clear to auscultation  Neurological Recent and Remote Memory: normal Attention Span and Concentration:   normal Language: normal Fund of Knowledge: normal  Right Left Sensation: normal normal Upper Extremity Coordination: normal normal  Lower Extremity Coordination: normal normal  Musculoskeletal Gait and Station: normal  Right Left Upper Extremity Muscle Strength: normal normal Lower Extremity Muscle Strength: normal normal Upper Extremity Muscle Tone:  normal normal Lower Extremity Muscle  Tone: normal normal  Motor Strength Upper and lower extremity motor strength was tested in the clinically pertinent muscles. Any abnormal findings will be noted below.   Right Left Knee Extensor: 4+/5    Deep Tendon Reflexes  Right Left Biceps: normal normal Triceps: normal normal Brachiloradialis: normal normal Patellar: normal normal Achilles: normal normal  Sensory Sensation was tested at L1 to S1.   Cranial Nerves II. Optic Nerve/Visual Fields: normal III. Oculomotor: normal IV. Trochlear: normal V. Trigeminal: normal VI. Abducens: normal VII. Facial: normal VIII. Acoustic/Vestibular: normal IX. Glossopharyngeal: normal X. Vagus: normal XI. Spinal Accessory: normal XII. Hypoglossal: normal  Motor and other Tests Lhermittes: negative Rhomberg: negative    Right Left Hoffman's: normal normal Clonus: normal normal Babinski: normal normal SLR: negative negative Patrick's Pearlean Brownie): negative negative Toe Walk: normal normal Toe Lift: normal normal Heel Walk: normal normal SI Joint: nontender nontender   Additional Findings:  Patient has right sciatic notch discomfort.  He is  able to stand on his heels and toes and bend within 4 inches of the floor with his upper extremities at stretch.  He is able to squat independently without difficult on the left but has weakness doing so on the right.    DIAGNOSTIC RESULTS MRI of the lumbar spine shows severe nerve root compression of the L4 nerve root at the L4 L5 level within the neural foramen on the right.  This is accompanied by facet joint cyst which is causing significant compression of the nerve.  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  There does not appear to be significant compression of other neural elements at other levels in the left-sided nerve root does not appear compressed.  He has multilevel degenerative changes throughout his lumbar spine particularly at the  thoracolumbar junction but no evidence of compressive pathology at other levels.    IMPRESSION My impression is the patient has an L4 radiculopathy on the right with spondylosis and stenosis facet joint cyst.  I told him that I do not think it is possible to do decompression without fusion as I will need to take his facet joint apart and this will render him unstable.  I recommended surgery because he is not getting relief with conservative measures.  I have also discussed with him the importance of weight control.  We'll going to try Neurontin to see if this will help with his discomfort.  He is fitted for an LSO brace today.  We did preoperative teaching  Completed Orders (this encounter) Order Details Reason Side Interpretation Result Initial Treatment Date Region  Lifestyle education regarding diet Encouraged to eat a well balanced diet and follow up with primary care physician.        Hypertension education Follow up with primary care physician.         Assessment/Plan # Detail Type Description   1. Assessment Herniated lumbar intervertebral disc (722.10).       2. 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       3. 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       4. Assessment Lumbar radiculopathy (724.4).       5. Assessment Lumbago (724.2).       6. Assessment BMI 37.0-37.9,ADULT (V85.37).   Plan Orders Today's instructions / counseling include(s) Lifestyle education regarding diet.       7. Assessment Hypertension, Unspecified (401.9).         Pain Assessment/Treatment Pain Scale: 4/10. Method: Numeric Pain Intensity Scale. Location: back/right leg. Onset: 12/23/2012. Duration: varies. Quality: dull, aching. Pain Assessment/Treatment follow-up plan of care: Patient currently taking pain medication as prescribed..  Plan is decompression and fusion L4 L5 level.  I told him we may not be able to put a spacer in from  the right depending on the location and anatomic constraints of the L4 nerve root particularly if this is a conjoined nerve root.  I explained that we may do decompression and not be able to fully relieve his leg pain.  He knows he importance of weight control in terms of overall strategy for managing this problem.  Orders: Instruction(s)/Education: Assessment Instruction  401.9 Hypertension education  V85.37 Lifestyle education regarding diet    MEDICATIONS PRESCRIBED TODAY    Rx Quantity Refills  NEURONTIN 100 mg  120 0  NORCO 5 mg-325 mg  90 0            Provider:  Danae OrleansJoseph D. Venetia MaxonStern MD  09/22/2013 10:20 AM Dictation edited by: Danae OrleansJoseph D. Venetia MaxonStern    CC Providers: Maeola HarmanJoseph Catherene Kaleta MD [address] [place], [place] [id number] ----------------------------------------------------------------------------------------------------------------------------------------------------------------------         Electronically signed by Danae OrleansJoseph D. Venetia MaxonStern MD on 09/22/2013 10:20 AM

## 2013-11-04 NOTE — Anesthesia Procedure Notes (Signed)
Procedure Name: Intubation Date/Time: 11/04/2013 9:43 AM Performed by: Quentin OreWALKER, Gabriellah Rabel E Pre-anesthesia Checklist: Patient identified, Emergency Drugs available, Suction available, Patient being monitored and Timeout performed Patient Re-evaluated:Patient Re-evaluated prior to inductionOxygen Delivery Method: Circle system utilized Preoxygenation: Pre-oxygenation with 100% oxygen Intubation Type: IV induction Ventilation: Mask ventilation without difficulty Laryngoscope Size: Mac and 4 Grade View: Grade II Tube type: Oral Tube size: 7.5 mm Number of attempts: 1 Airway Equipment and Method: Stylet Placement Confirmation: ETT inserted through vocal cords under direct vision,  positive ETCO2 and breath sounds checked- equal and bilateral Secured at: 23 cm Tube secured with: Tape Dental Injury: Teeth and Oropharynx as per pre-operative assessment

## 2013-11-04 NOTE — Transfer of Care (Signed)
Immediate Anesthesia Transfer of Care Note  Patient: Cody Park  Procedure(s) Performed: Procedure(s) with comments: FOR MAXIMUM ACCESS (MAS) POSTERIOR LUMBAR INTERBODY FUSION (PLIF) 1 LEVEL (N/A) - L4-5 maximum access posterior lumbar interbody fusion  Patient Location: PACU  Anesthesia Type:General  Level of Consciousness: sedated  Airway & Oxygen Therapy: Patient Spontanous Breathing and Patient connected to nasal cannula oxygen  Post-op Assessment: Report given to PACU RN, Post -op Vital signs reviewed and stable and Patient moving all extremities X 4  Post vital signs: Reviewed and stable  Complications: No apparent anesthesia complications

## 2013-11-04 NOTE — Progress Notes (Signed)
Awake, alert, conversant.  MAEW with good strength.  Doing well. 

## 2013-11-04 NOTE — Progress Notes (Signed)
Utilization review complete 

## 2013-11-04 NOTE — Op Note (Signed)
11/04/2013  12:38 PM  PATIENT:  Cody Park  53 y.o. 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, Lumbar stenosis, Lumbar radiculopathy L 45 level  PROCEDURE:  Procedure(s) with comments: FOR MAXIMUM ACCESS (MAS) POSTERIOR LUMBAR INTERBODY FUSION (PLIF) 1 LEVEL (N/A) - L4-5 maximum access posterior lumbar interbody fusion with PEEK interbody cage via TLIF approach with local bone autograft and allograft, pedicle screw fixation and posterolateral arthrodesis  Decompression greater than standard PLIF procedure  SURGEON:  Surgeon(s) and Role:    * Maeola HarmanJoseph Mykal Batiz, MD - Primary    * Barnett AbuHenry Elsner, MD - Assisting  PHYSICIAN ASSISTANT:   ASSISTANTS: Poteat, RN   ANESTHESIA:   general  EBL:  Total I/O In: 1000 [I.V.:1000] Out: 450 [Urine:300; Blood:150]  BLOOD ADMINISTERED:none  DRAINS: none   LOCAL MEDICATIONS USED:  MARCAINE     SPECIMEN:  No Specimen  DISPOSITION OF SPECIMEN:  N/A  COUNTS:  YES  TOURNIQUET:  * No tourniquets in log *  DICTATION: Patient is a 53 year old with spondylosis , stenosis, spondylolisthesis, disc herniation and severe back and right lower extremity pain at L4/5 levels of the lumbar spine. It was elected to take him to surgery for MASPLIF L 45 level with posterolateral arthrodesis with resection of synovial cyst.  Procedure:   Following uncomplicated induction of GETA, and placement of electrodes for neural monitoring, patient was turned into a prone position on the [place] tableand using AP  fluoroscopy the area of planned incision was marked, prepped with betadine scrub and Duraprep, then draped. Exposure was performed of facet joint complex at L 45 level and the MAS retractor was placed.5.5 x 30 mm cortical Nuvasive screws were placed on the right at L 4 and 5.5 x 35 mm  screw on the left according to standard landmarks using neural monitoring.  A total laminectomy of L 4 was then performed with disarticulation of facets.  This bone was saved for grafting, combined with Osteocel after being run through bone mill and was placed in bone packing device.  Thorough discectomy was performed on the left at L 45 and the endplates were prepared for grafting.  The L 4 nerve root on the right was conjoined and exited the foramen at the level of the disc space.  A synovial cyst was removed which was compressing the take off of the right sided nerve root.  A 10 mm TLIF x 8  degree cage was placed in the interspace and positioning was confirmed with AP and lateral fluoroscopy from the left.  10 cc of autograft/Osteocel was packed in the interspace deep to the cage.   Remaining screws were placed at L 5 and 35 mm rods were placed.   And the screws were locked and torqued. Final Xrays showed well positioned implants and screw fixation. The posterolateral region was packed with remaining 10 cc of autograft on the left of midline. The wounds were irrigated and then closed with 1, 2-0 and 3-0 Vicryl stitches. 20 cc long-acting Marcaine was injected in the subcutaneous tissues. Sterile occlusive dressing was placed with Dermabond. The patient was then extubated in the operating room and taken to recovery in stable and satisfactory condition having tolerated his operation well. Counts were correct at the end of the case.  PLAN OF CARE: Admit to inpatient   PATIENT DISPOSITION:  PACU - hemodynamically stable.  Delay start of Pharmacological VTE agent (>24hrs) due to surgical blood loss or risk of bleeding: yes

## 2013-11-04 NOTE — Addendum Note (Signed)
Addendum created 11/04/13 1413 by Quentin OreLuz E Jacobey Gura, CRNA   Modules edited: Anesthesia Blocks and Procedures, Clinical Notes   Clinical Notes:  File: [id number]

## 2013-11-05 MED ORDER — OXYCODONE-ACETAMINOPHEN 5-325 MG PO TABS: 1.0000 | ORAL_TABLET | ORAL | Status: DC | PRN

## 2013-11-05 MED ORDER — CYCLOBENZAPRINE HCL 10 MG PO TABS: 10.0000 mg | ORAL_TABLET | Freq: Three times a day (TID) | ORAL | Status: AC | PRN

## 2013-11-05 NOTE — Progress Notes (Signed)
OT Cancellation Note  Patient Details Name: Leandro Reasonerhillip G Buening MRN: [id number] DOB: March 06, 1961   Cancelled Treatment:    Reason Eval/Treat Not Completed: OT screened. OT in room for 1 minute and only question pt had was about performing hygiene which OT educated on. No further OT needs.   Earlie RavelingStraub, Cameron Schwinn L OTR/L [phone] 11/05/2013, 9:41 AM

## 2013-11-05 NOTE — Progress Notes (Signed)
Subjective: Patient reports "I'm doing good"  Objective: Vital signs in last 24 hours: Temp:  [98.2 F (36.8 C)-99.7 F (37.6 C)] 99 F (37.2 C) (08/05 0814) Pulse Rate:  [69-99] 81 (08/05 0814) Resp:  [10-20] 18 (08/05 0814) BP: (97-140)/(60-83) 129/83 mmHg (08/05 0814) SpO2:  [95 %-100 %] 97 % (08/05 0814)  Intake/Output from previous day: 08/04 0701 - 08/05 0700 In: 2140 [P.O.:840; I.V.:1300] Out: 550 [Urine:400; Blood:150] Intake/Output this shift:    Sitting in chair, alert, conversant. Wife present. Pt pleased with resolution of leg pain. Ambulating without difficulty.  Notes some lumbar pain, but managed well with position changes & Percocet.   Lab Results: No results found for this basename: WBC, HGB, HCT, PLT,  in the last 72 hours BMET No results found for this basename: NA, K, CL, CO2, GLUCOSE, BUN, CREATININE, CALCIUM,  in the last 72 hours  Studies/Results: Dg Lumbar Spine 2-3 Views  11/04/2013   CLINICAL DATA:  Localization images during lumbar spine fusion.  EXAM: LUMBAR SPINE - 2-3 VIEW; DG C-ARM 61-120 MIN  COMPARISON:  09/22/2013  FINDINGS: Images show placement of bilateral pedicle screws at L4 and L5, and a radiolucent disc spacer, well centered maintaining disc height at L4-L5. There is no acute fracture or evidence of an operative complication.  IMPRESSION: Portable operative lumbar spine images during lumbar spine fusion as described.   Electronically Signed   By: Amie Portlandavid  Ormond M.D.   On: 11/04/2013 13:24   Dg C-arm 1-60 Min  11/04/2013   CLINICAL DATA:  Localization images during lumbar spine fusion.  EXAM: LUMBAR SPINE - 2-3 VIEW; DG C-ARM 61-120 MIN  COMPARISON:  09/22/2013  FINDINGS: Images show placement of bilateral pedicle screws at L4 and L5, and a radiolucent disc spacer, well centered maintaining disc height at L4-L5. There is no acute fracture or evidence of an operative complication.  IMPRESSION: Portable operative lumbar spine images during lumbar  spine fusion as described.   Electronically Signed   By: Amie Portlandavid  Ormond M.D.   On: 11/04/2013 13:24    Assessment/Plan: Improved   LOS: 1 day  Per Dr. Danielle DessElsner, d/c to home. Pt & wife verbalize understanding of d/c instructions and will call office on Monday to schedule 3-4 week f/u appt.    Georgiann Cockeroteat, Magaline Steinberg 11/05/2013, 9:56 AM

## 2013-11-05 NOTE — Progress Notes (Signed)
Patient alert and oriented, mae's well, voiding adequate amount of urine, swallowing without difficulty, c/o minimal pain. Patient discharged home with family. Script and discharged instructions given to patient. Patient and family stated understanding of instructions given.  

## 2013-11-05 NOTE — Discharge Instructions (Signed)

## 2013-11-05 NOTE — Progress Notes (Signed)
PT Cancellation Note  Patient Details Name: Cody Park [name] MRN: [id number] DOB: 22-Oct-1960   Cancelled Treatment:    Reason Eval/Treat Not Completed: PT screened, no needs identified, will sign off.  Pt up ambulating in hallway with wife.  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  Reviewed and gave illustrated handout.  No skilled PT needs identified and will d/c.   Niza Soderholm LUBECK 11/05/2013, 9:27 AM

## 2013-11-05 NOTE — Discharge Summary (Signed)
Physician Discharge Summary  Patient ID: Cody Park MRN: [id number] DOB/AGE: 1961-02-09 53 y.o.  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  Discharge Exam: Blood pressure 129/83, pulse 81, temperature 99 F (37.2 C), temperature source Oral, resp. rate 18, weight 119.75 kg (264 lb), SpO2 97.00%. Incision is clean and dry, motor function is intact in lower external  Disposition: Discharge home  Discharge Instructions   Call MD for:  redness, tenderness, or signs of infection (pain, swelling, redness, odor or green/yellow discharge around incision site)    Complete by:  As directed      Call MD for:  severe uncontrolled pain    Complete by:  As directed      Call MD for:  temperature >100.4    Complete by:  As directed      Diet - low sodium heart healthy    Complete by:  As directed      Increase activity slowly    Complete by:  As directed             Medication List         acetaminophen 500 MG tablet  Commonly known as:  TYLENOL  Take 1,000 mg by mouth 2 (two) times daily as needed for mild pain.     aspirin EC 81 MG tablet  Take 81 mg by mouth daily.     carbamazepine 200 MG tablet  Commonly known as:  TEGRETOL  Take 300 mg by mouth 2 (two) times daily.     cyclobenzaprine 10 MG tablet  Commonly known as:  FLEXERIL  Take 10 mg by mouth daily as needed for muscle spasms.     cyclobenzaprine 10 MG tablet  Commonly known as:  FLEXERIL  Take 1 tablet (10 mg total) by mouth  3 (three) times daily as needed for muscle spasms.     docusate sodium 100 MG capsule  Commonly known as:  COLACE  Take 100 mg by mouth every morning.     enalapril 5 MG tablet  Commonly known as:  VASOTEC  Take 5 mg by mouth daily.     fexofenadine 180 MG tablet  Commonly known as:  ALLEGRA  Take 180 mg by mouth daily.     HYDROcodone-acetaminophen 5-325 MG per tablet  Commonly known as:  NORCO/VICODIN  Take 1 tablet by mouth every 6 (six) hours as needed for moderate pain.     ibuprofen 200 MG tablet  Commonly known as:  ADVIL,MOTRIN  Take 400 mg by mouth 2 (two) times daily as needed for mild pain.     oxyCODONE-acetaminophen 5-325 MG per tablet  Commonly known as:  PERCOCET/ROXICET  Take 1 tablet by mouth every 6 (six) hours as needed for severe pain.     oxyCODONE-acetaminophen 5-325 MG per tablet  Commonly known as:  PERCOCET/ROXICET  Take 1-2 tablets by mouth every 4 (four) hours as needed for moderate pain.     pravastatin 20 MG tablet  Commonly known as:  PRAVACHOL  Take 20 mg by mouth daily.         SignedStefani Dama: Lacheryl Niesen J 11/05/2013, 9:09 AM

## 2013-11-06 ENCOUNTER — Encounter (HOSPITAL_COMMUNITY): Payer: Self-pay | Admitting: Neurosurgery

## 2015-08-07 IMAGING — CR DG CHEST 2V
2 series · 2 of 2 positions shown · non-contrast
Comparison: None.

CLINICAL DATA: PAT, preop

EXAM:
CHEST  2 VIEW

[w chest pa]
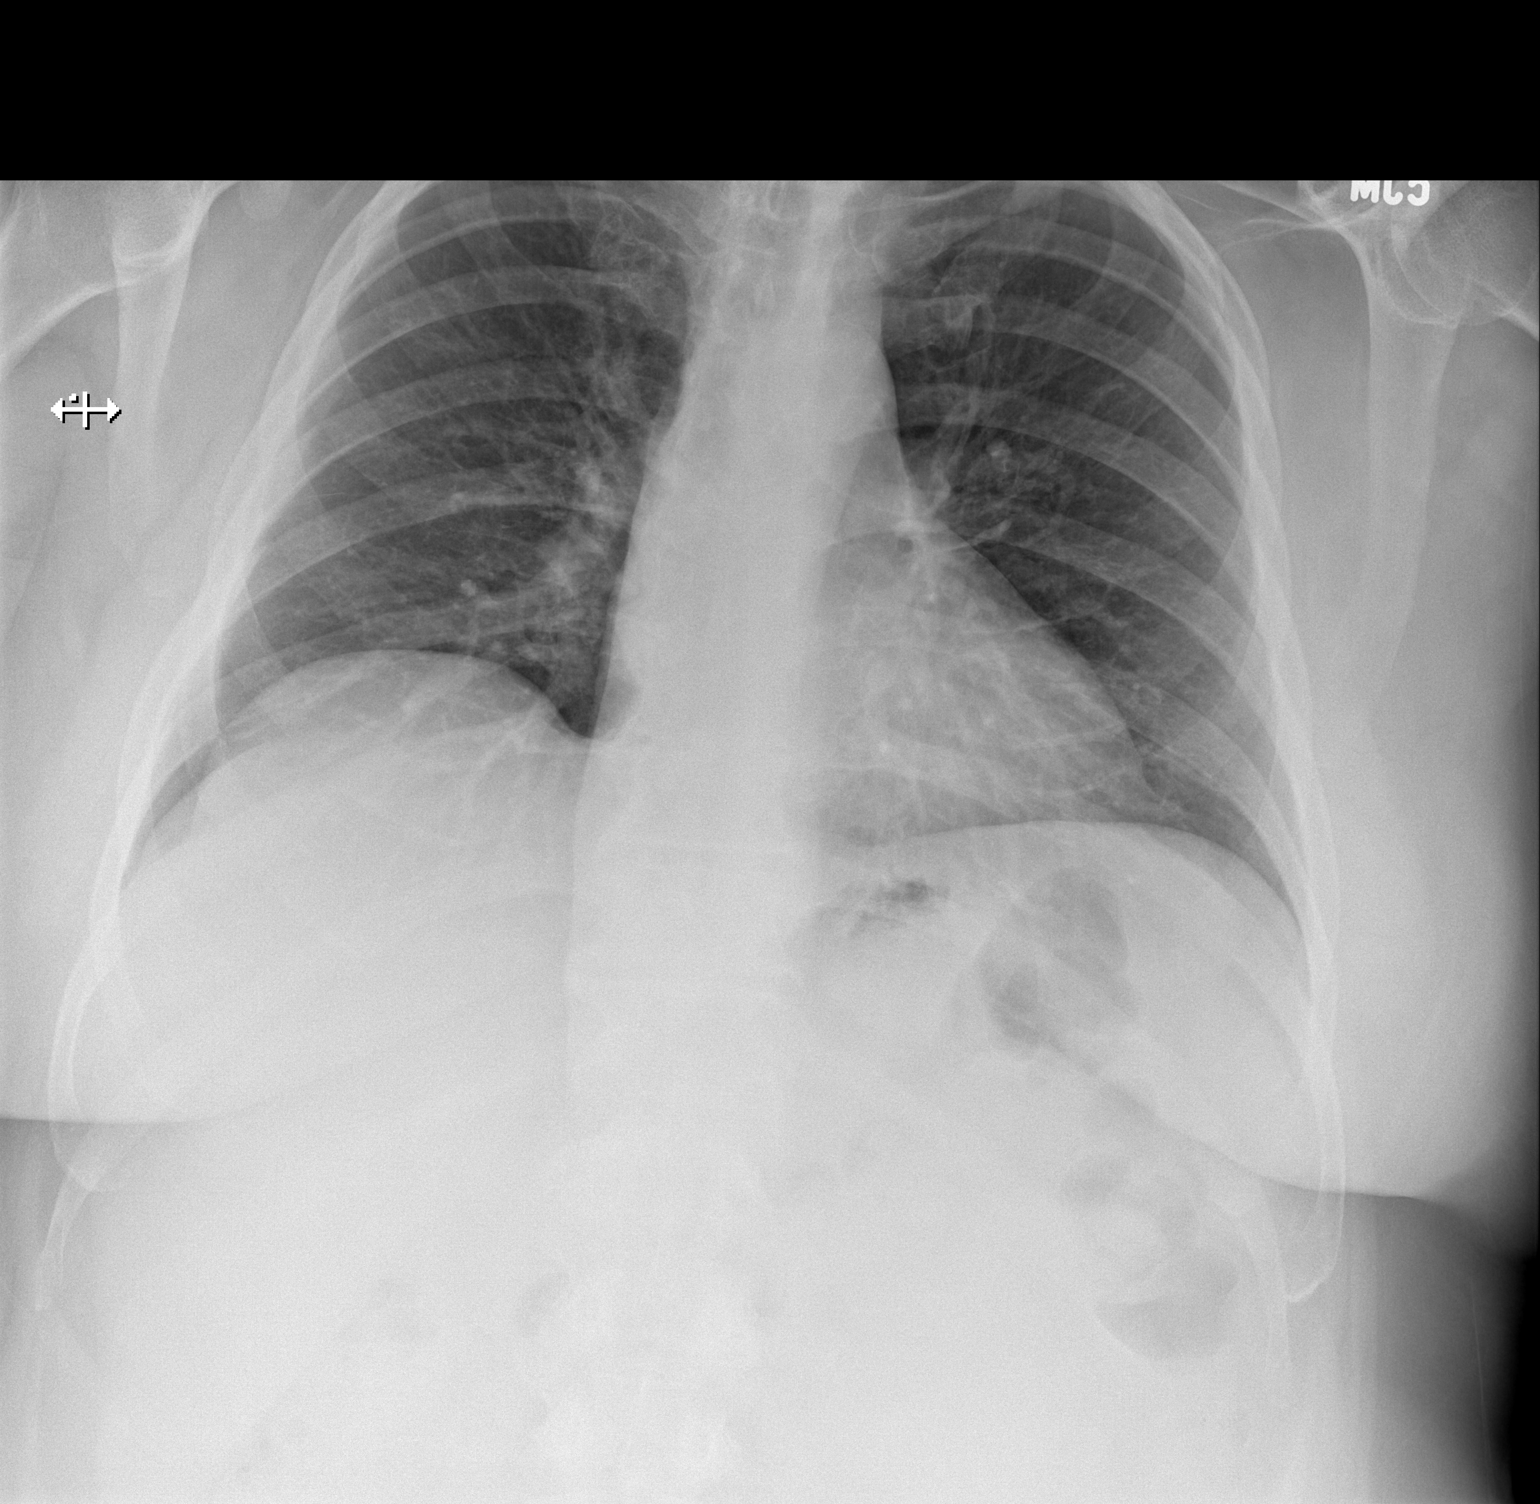

[w chest lat]
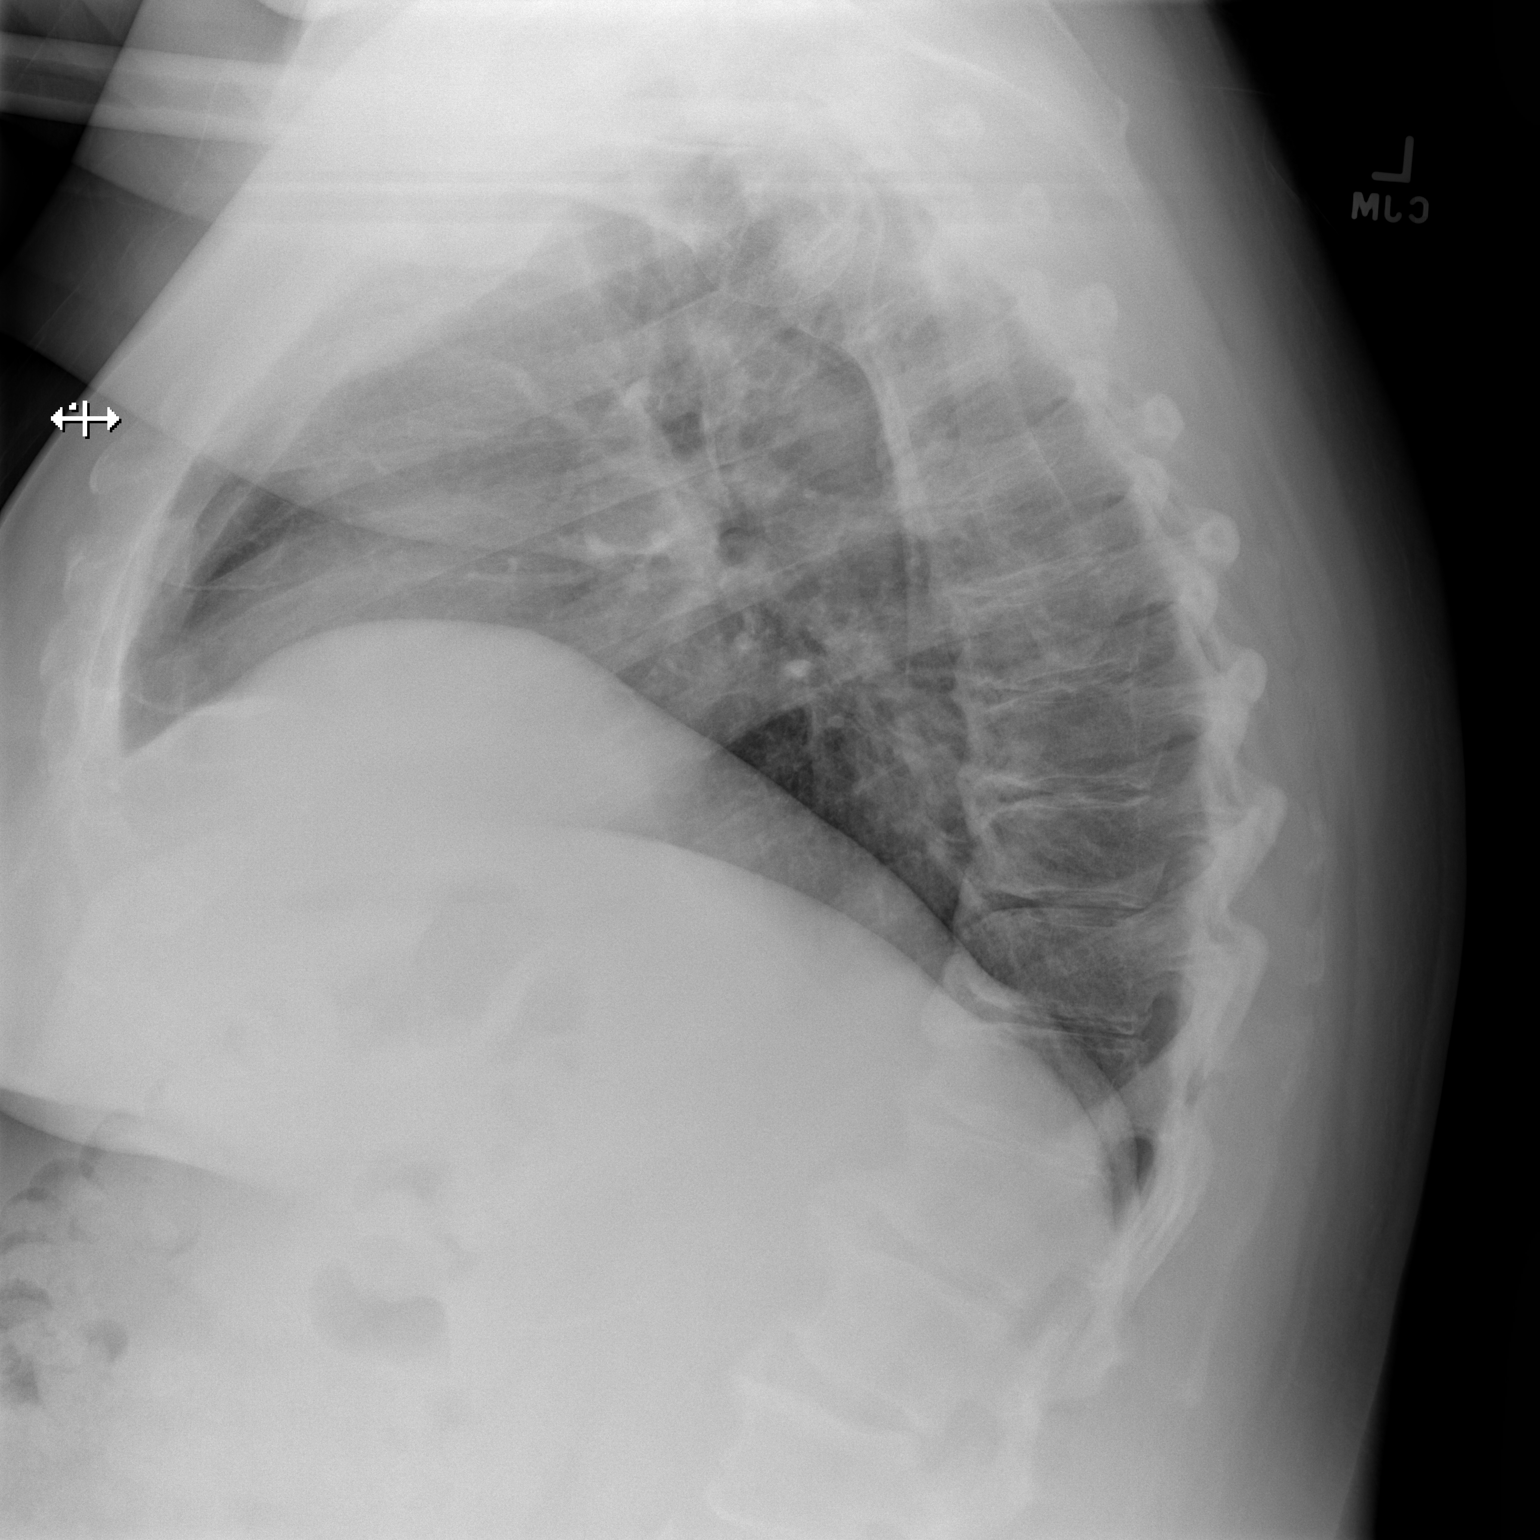

[2 of 2 positions shown; findings below may reference images not displayed]

FINDINGS: Cardiomediastinal silhouette is unremarkable. No acute infiltrate or
pleural effusion. No pulmonary edema. Mild degenerative changes
thoracolumbar spine.
IMPRESSION: No active cardiopulmonary disease.

## 2023-01-05 ENCOUNTER — Other Ambulatory Visit: Payer: Self-pay | Admitting: Neurosurgery

## 2023-02-16 NOTE — Progress Notes (Signed)
Surgical Instructions   Your procedure is scheduled on March 07, 2023. Report to [facility] Main Entrance "A" at 6:30 A.M., then check in with the Admitting office. Any questions or running late day of surgery: call [phone]  Questions prior to your surgery date: call [phone], Monday-Friday, 8am-4pm. If you experience any cold or flu symptoms such as cough, fever, chills, shortness of breath, etc. between now and your scheduled surgery, please notify us at the above number.     Remember:  Do not eat or drink after midnight the night before your surgery    Take these medicines the morning of surgery with A SIP OF WATER   carbamazepine (TEGRETOL)  docusate sodium (COLACE)  famotidine (PEPCID)  levocetirizine (XYZAL)  pravastatin (PRAVACHOL)   May take these medicines IF NEEDED: acetaminophen (TYLENOL)  cyclobenzaprine (FLEXERIL) traMADol (ULTRAM)      One week prior to surgery, STOP taking any Aspirin (unless otherwise instructed by your surgeon) Aleve, Naproxen, Ibuprofen, Motrin, Advil, Goody's, BC's, all herbal medications, fish oil, and non-prescription vitamins.                     Do NOT Smoke (Tobacco/Vaping) for 24 hours prior to your procedure.  If you use a CPAP at night, you may bring your mask/headgear for your overnight stay.   You will be asked to remove any contacts, glasses, piercing's, hearing aid's, dentures/partials prior to surgery. Please bring cases for these items if needed.    Patients discharged the day of surgery will not be allowed to drive home, and someone needs to stay with them for 24 hours.  SURGICAL WAITING ROOM VISITATION Patients may have no more than 2 support people in the waiting area - these visitors may rotate.   Pre-op nurse will coordinate an appropriate time for 1 ADULT support person, who may not rotate, to accompany patient in pre-op.  Children under the age of 43 must have an adult with them who is not the patient and  must remain in the main waiting area with an adult.  If the patient needs to stay at the hospital during part of their recovery, the visitor guidelines for inpatient rooms apply.  Please refer to the [facility] website for the visitor guidelines for any additional information.   If you received a COVID test during your pre-op visit  it is requested that you wear a mask when out in public, stay away from anyone that may not be feeling well and notify your surgeon if you develop symptoms. If you have been in contact with anyone that has tested positive in the last 10 days please notify you surgeon.      Pre-operative 5 CHG Bathing Instructions   You can play a key role in reducing the risk of infection after surgery. Your skin needs to be as free of germs as possible. You can reduce the number of germs on your skin by washing with CHG (chlorhexidine gluconate) soap before surgery. CHG is an antiseptic soap that kills germs and continues to kill germs even after washing.   DO NOT use if you have an allergy to chlorhexidine/CHG or antibacterial soaps. If your skin becomes reddened or irritated, stop using the CHG and notify one of our RNs at [phone].   Please shower with the CHG soap starting 4 days before surgery using the following schedule:     Please keep in mind the following:  DO NOT shave, including legs and underarms, starting  the day of your first shower.   You may shave your face at any point before/day of surgery.  Place clean sheets on your bed the day you start using CHG soap. Use a clean washcloth (not used since being washed) for each shower. DO NOT sleep with pets once you start using the CHG.   CHG Shower Instructions:  Wash your face and private area with normal soap. If you choose to wash your hair, wash first with your normal shampoo.  After you use shampoo/soap, rinse your hair and body thoroughly to remove shampoo/soap residue.  Turn the water OFF and apply  about 3 tablespoons (45 ml) of CHG soap to a CLEAN washcloth.  Apply CHG soap ONLY FROM YOUR NECK DOWN TO YOUR TOES (washing for 3-5 minutes)  DO NOT use CHG soap on face, private areas, open wounds, or sores.  Pay special attention to the area where your surgery is being performed.  If you are having back surgery, having someone wash your back for you may be helpful. Wait 2 minutes after CHG soap is applied, then you may rinse off the CHG soap.  Pat dry with a clean towel  Put on clean clothes/pajamas   If you choose to wear lotion, please use ONLY the CHG-compatible lotions on the back of this paper.   Additional instructions for the day of surgery: DO NOT APPLY any lotions, deodorants, cologne, or perfumes.   Do not bring valuables to the hospital. [facility] is not responsible for any belongings/valuables. Do not wear nail polish, gel polish, artificial nails, or any other type of covering on natural nails (fingers and toes) Do not wear jewelry or makeup Put on clean/comfortable clothes.  Please brush your teeth.  Ask your nurse before applying any prescription medications to the skin.     CHG Compatible Lotions   Aveeno Moisturizing lotion  Cetaphil Moisturizing Cream  Cetaphil Moisturizing Lotion  Clairol Herbal Essence Moisturizing Lotion, Dry Skin  Clairol Herbal Essence Moisturizing Lotion, Extra Dry Skin  Clairol Herbal Essence Moisturizing Lotion, Normal Skin  Curel Age Defying Therapeutic Moisturizing Lotion with Alpha Hydroxy  Curel Extreme Care Body Lotion  Curel Soothing Hands Moisturizing Hand Lotion  Curel Therapeutic Moisturizing Cream, Fragrance-Free  Curel Therapeutic Moisturizing Lotion, Fragrance-Free  Curel Therapeutic Moisturizing Lotion, Original Formula  Eucerin Daily Replenishing Lotion  Eucerin Dry Skin Therapy Plus Alpha Hydroxy Crme  Eucerin Dry Skin Therapy Plus Alpha Hydroxy Lotion  Eucerin Original Crme  Eucerin Original Lotion  Eucerin  Plus Crme Eucerin Plus Lotion  Eucerin TriLipid Replenishing Lotion  Keri Anti-Bacterial Hand Lotion  Keri Deep Conditioning Original Lotion Dry Skin Formula Softly Scented  Keri Deep Conditioning Original Lotion, Fragrance Free Sensitive Skin Formula  Keri Lotion Fast Absorbing Fragrance Free Sensitive Skin Formula  Keri Lotion Fast Absorbing Softly Scented Dry Skin Formula  Keri Original Lotion  Keri Skin Renewal Lotion Keri Silky Smooth Lotion  Keri Silky Smooth Sensitive Skin Lotion  Nivea Body Creamy Conditioning Oil  Nivea Body Extra Enriched Lotion  Nivea Body Original Lotion  Nivea Body Sheer Moisturizing Lotion Nivea Crme  Nivea Skin Firming Lotion  NutraDerm 30 Skin Lotion  NutraDerm Skin Lotion  NutraDerm Therapeutic Skin Cream  NutraDerm Therapeutic Skin Lotion  ProShield Protective Hand Cream  Provon moisturizing lotion  Please read over the following fact sheets that you were given.

## 2023-02-19 ENCOUNTER — Encounter (HOSPITAL_COMMUNITY)
Admission: RE | Admit: 2023-02-19 | Discharge: 2023-02-19 | Disposition: A | Discharge status: Home / Self Care | Payer: BC Managed Care – PPO | Source: Ambulatory Visit | Attending: Neurosurgery | Admitting: Neurosurgery

## 2023-02-19 ENCOUNTER — Encounter (HOSPITAL_COMMUNITY): Payer: Self-pay

## 2023-02-19 ENCOUNTER — Other Ambulatory Visit: Payer: Self-pay

## 2023-02-19 VITALS — BP 155/84 | HR 65 | Temp 98.4°F | Resp 17 | Ht 69.0 in | Wt 247.0 lb

## 2023-02-19 DIAGNOSIS — Z01818 Encounter for other preprocedural examination: Secondary | ICD-10-CM

## 2023-02-19 HISTORY — DX: Myoneural disorder, unspecified: G70.9

## 2023-02-19 LAB — CBC
HCT: 40.8 % (ref 39.0–52.0)
Hemoglobin: 13.4 g/dL (ref 13.0–17.0)
MCH: 31.1 pg (ref 26.0–34.0)
MCHC: 32.8 g/dL (ref 30.0–36.0)
MCV: 94.7 fL (ref 80.0–100.0)
Platelets: 255 10*3/uL (ref 150–400)
RBC: 4.31 MIL/uL (ref 4.22–5.81)
RDW: 13.1 % (ref 11.5–15.5)
WBC: 7.1 10*3/uL (ref 4.0–10.5)
nRBC: 0 % (ref 0.0–0.2)

## 2023-02-19 LAB — TYPE AND SCREEN
ABO/RH(D): A POS
Antibody Screen: NEGATIVE

## 2023-02-19 LAB — SURGICAL PCR SCREEN
MRSA, PCR: NEGATIVE
Staphylococcus aureus: NEGATIVE

## 2023-02-19 NOTE — Progress Notes (Signed)
PCP - Dianne Elliott,NP Cardiologist - denies  PPM/ICD - denies Device Orders -  Rep Notified -   Chest x-ray -  EKG - 02/19/23 Stress Test - denies ECHO - 11/03/13 Cardiac Cath - denies  Sleep Study - yes CPAP - yes  Fasting Blood Sugar - na Checks Blood Sugar _____ times a day  Last dose of GLP1 agonist-  na GLP1 instructions:   Blood Thinner Instructions:na Aspirin Instructions:na  ERAS Protcol -no PRE-SURGERY Ensure or G2-   COVID TEST- no   Anesthesia review: no  Patient denies shortness of breath, fever, cough and chest pain at PAT appointment   All instructions explained to the patient, with a verbal understanding of the material. Patient agrees to go over the instructions while at home for a better understanding. Patient also instructed to wear a mask when out in public prior to surgery. The opportunity to ask questions was provided.

## 2023-03-06 NOTE — Anesthesia Preprocedure Evaluation (Signed)
Anesthesia Evaluation  Patient identified by MRN, date of birth, ID band Patient awake    Reviewed: Allergy & Precautions, H&P , NPO status , Patient's Chart, lab work & pertinent test results  Airway Mallampati: II  TM Distance: >3 FB Neck ROM: Full    Dental no notable dental hx. (+) Teeth Intact, Dental Advisory Given   Pulmonary asthma , former smoker   Pulmonary exam normal breath sounds clear to auscultation       Cardiovascular Exercise Tolerance: Good hypertension, Pt. on medications  Rhythm:Regular Rate:Normal     Neuro/Psych Seizures -, Well Controlled,   negative psych ROS   GI/Hepatic Neg liver ROS,GERD  Medicated,,  Endo/Other  negative endocrine ROS    Renal/GU negative Renal ROS  negative genitourinary   Musculoskeletal  (+) Arthritis , Osteoarthritis,    Abdominal   Peds  Hematology negative hematology ROS (+)   Anesthesia Other Findings   Reproductive/Obstetrics negative OB ROS                             Anesthesia Physical Anesthesia Plan  ASA: 2  Anesthesia Plan: General   Post-op Pain Management: Tylenol PO (pre-op)*   Induction: Intravenous  PONV Risk Score and Plan: 3 and Ondansetron, Dexamethasone and Midazolam  Airway Management Planned: Oral ETT  Additional Equipment:   Intra-op Plan:   Post-operative Plan: Extubation in OR  Informed Consent: I have reviewed the patients History and Physical, chart, labs and discussed the procedure including the risks, benefits and alternatives for the proposed anesthesia with the patient or authorized representative who has indicated his/her understanding and acceptance.     Dental advisory given  Plan Discussed with: CRNA  Anesthesia Plan Comments:        Anesthesia Quick Evaluation

## 2023-03-07 ENCOUNTER — Ambulatory Visit (HOSPITAL_COMMUNITY): Payer: Self-pay | Admitting: Anesthesiology

## 2023-03-07 ENCOUNTER — Other Ambulatory Visit: Payer: Self-pay

## 2023-03-07 ENCOUNTER — Ambulatory Visit (HOSPITAL_COMMUNITY)
Admission: RE | Admit: 2023-03-07 | Discharge: 2023-03-08 | Disposition: A | Discharge status: Home / Self Care | Payer: BC Managed Care – PPO | Attending: Neurosurgery | Admitting: Neurosurgery

## 2023-03-07 ENCOUNTER — Ambulatory Visit (HOSPITAL_COMMUNITY): Payer: BC Managed Care – PPO

## 2023-03-07 ENCOUNTER — Encounter (HOSPITAL_COMMUNITY)
Admission: RE | Disposition: A | Discharge status: Home / Self Care | Payer: Self-pay | Source: Home / Self Care | Attending: Neurosurgery

## 2023-03-07 ENCOUNTER — Encounter (HOSPITAL_COMMUNITY): Payer: Self-pay | Admitting: Neurosurgery

## 2023-03-07 DIAGNOSIS — R569 Unspecified convulsions: Secondary | ICD-10-CM | POA: Diagnosis not present

## 2023-03-07 DIAGNOSIS — M4727 Other spondylosis with radiculopathy, lumbosacral region: Secondary | ICD-10-CM | POA: Diagnosis not present

## 2023-03-07 DIAGNOSIS — M4807 Spinal stenosis, lumbosacral region: Secondary | ICD-10-CM | POA: Diagnosis present

## 2023-03-07 DIAGNOSIS — M1990 Unspecified osteoarthritis, unspecified site: Secondary | ICD-10-CM | POA: Diagnosis not present

## 2023-03-07 DIAGNOSIS — Z01818 Encounter for other preprocedural examination: Secondary | ICD-10-CM

## 2023-03-07 DIAGNOSIS — Z981 Arthrodesis status: Secondary | ICD-10-CM | POA: Diagnosis not present

## 2023-03-07 DIAGNOSIS — Z87891 Personal history of nicotine dependence: Secondary | ICD-10-CM | POA: Insufficient documentation

## 2023-03-07 DIAGNOSIS — M48061 Spinal stenosis, lumbar region without neurogenic claudication: Secondary | ICD-10-CM | POA: Diagnosis present

## 2023-03-07 LAB — BASIC METABOLIC PANEL
Anion gap: 12 (ref 5–15)
BUN: 19 mg/dL (ref 6–20)
CO2: 21 mmol/L — ABNORMAL LOW (ref 22–32)
Calcium: 8.9 mg/dL (ref 8.9–10.3)
Chloride: 105 mmol/L (ref 98–111)
Creatinine, Ser: 0.76 mg/dL (ref 0.61–1.24)
GFR, Estimated: >60 mL/min (ref >60)
Glucose, Bld: 129 mg/dL — ABNORMAL HIGH (ref 70–99)
Potassium: 3.5 mmol/L (ref 3.5–5.1)
Sodium: 138 mmol/L (ref 135–145)

## 2023-03-07 SURGERY — POSTERIOR LUMBAR FUSION 1 WITH HARDWARE REMOVAL
Anesthesia: General | Site: Back

## 2023-03-07 MED ORDER — DIPHENHYDRAMINE HCL 25 MG PO CAPS: 50.0000 mg | ORAL_CAPSULE | Freq: Every evening | ORAL | Status: DC | PRN

## 2023-03-07 MED ORDER — ENALAPRIL MALEATE 10 MG PO TABS
10.0000 mg | ORAL_TABLET | Freq: Every day | ORAL | Status: DC
  Administered 2023-03-07 – 2023-03-08 (×2): 10 mg via ORAL
  Filled 2023-03-07 (×3): qty 1

## 2023-03-07 MED ORDER — PROPOFOL 500 MG/50ML IV EMUL
INTRAVENOUS | Status: DC | PRN
Start: 2023-03-07 — End: 2023-03-07
  Administered 2023-03-07: 100 ug/kg/min via INTRAVENOUS

## 2023-03-07 MED ORDER — THROMBIN 20000 UNITS EX SOLR
CUTANEOUS | Status: AC
  Filled 2023-03-07: qty 20000

## 2023-03-07 MED ORDER — THROMBIN 5000 UNITS EX SOLR: OROMUCOSAL | Status: DC | PRN

## 2023-03-07 MED ORDER — DEXAMETHASONE SODIUM PHOSPHATE 10 MG/ML IJ SOLN
INTRAMUSCULAR | Status: AC
  Filled 2023-03-07: qty 1

## 2023-03-07 MED ORDER — FENTANYL CITRATE (PF) 250 MCG/5ML IJ SOLN
INTRAMUSCULAR | Status: DC | PRN
  Administered 2023-03-07: 100 ug via INTRAVENOUS
  Administered 2023-03-07 (×2): 50 ug via INTRAVENOUS

## 2023-03-07 MED ORDER — IPRATROPIUM BROMIDE 0.06 % NA SOLN
2.0000 | Freq: Two times a day (BID) | NASAL | Status: DC
  Administered 2023-03-08: 2 via NASAL
  Filled 2023-03-07: qty 15

## 2023-03-07 MED ORDER — CARBAMAZEPINE 200 MG PO TABS
300.0000 mg | ORAL_TABLET | Freq: Two times a day (BID) | ORAL | Status: DC
  Administered 2023-03-07 – 2023-03-08 (×2): 300 mg via ORAL
  Filled 2023-03-07 (×4): qty 1.5

## 2023-03-07 MED ORDER — SODIUM CHLORIDE 0.9% FLUSH: 3.0000 mL | INTRAVENOUS | Status: DC | PRN

## 2023-03-07 MED ORDER — PHENOL 1.4 % MT LIQD: 1.0000 | OROMUCOSAL | Status: DC | PRN

## 2023-03-07 MED ORDER — ALUM & MAG HYDROXIDE-SIMETH 200-200-20 MG/5ML PO SUSP: 30.0000 mL | Freq: Four times a day (QID) | ORAL | Status: DC | PRN

## 2023-03-07 MED ORDER — CYCLOBENZAPRINE HCL 10 MG PO TABS
10.0000 mg | ORAL_TABLET | Freq: Three times a day (TID) | ORAL | Status: DC | PRN
  Administered 2023-03-07 – 2023-03-08 (×3): 10 mg via ORAL
  Filled 2023-03-07 (×3): qty 1

## 2023-03-07 MED ORDER — HYDROMORPHONE HCL 1 MG/ML IJ SOLN
INTRAMUSCULAR | Status: AC
  Filled 2023-03-07: qty 1

## 2023-03-07 MED ORDER — SODIUM CHLORIDE 0.9 % IV SOLN
250.0000 mL | INTRAVENOUS | Status: DC
  Administered 2023-03-07: 250 mL via INTRAVENOUS

## 2023-03-07 MED ORDER — PRAVASTATIN SODIUM 10 MG PO TABS
20.0000 mg | ORAL_TABLET | Freq: Every day | ORAL | Status: DC
  Administered 2023-03-07 – 2023-03-08 (×2): 20 mg via ORAL
  Filled 2023-03-07 (×2): qty 2

## 2023-03-07 MED ORDER — FENTANYL CITRATE (PF) 250 MCG/5ML IJ SOLN
INTRAMUSCULAR | Status: AC
  Filled 2023-03-07: qty 5

## 2023-03-07 MED ORDER — ONDANSETRON HCL 4 MG/2ML IJ SOLN: 4.0000 mg | Freq: Four times a day (QID) | INTRAMUSCULAR | Status: DC | PRN

## 2023-03-07 MED ORDER — HYDROMORPHONE HCL 1 MG/ML IJ SOLN
0.2500 mg | INTRAMUSCULAR | Status: DC | PRN
Start: 2023-03-07 — End: 2023-03-07
  Administered 2023-03-07 (×3): 0.5 mg via INTRAVENOUS

## 2023-03-07 MED ORDER — DOCUSATE SODIUM 100 MG PO CAPS
100.0000 mg | ORAL_CAPSULE | Freq: Every morning | ORAL | Status: DC
  Administered 2023-03-07 – 2023-03-08 (×2): 100 mg via ORAL
  Filled 2023-03-07 (×2): qty 1

## 2023-03-07 MED ORDER — HYDROMORPHONE HCL 1 MG/ML IJ SOLN
0.5000 mg | INTRAMUSCULAR | Status: DC | PRN
Start: 2023-03-07 — End: 2023-03-08

## 2023-03-07 MED ORDER — 0.9 % SODIUM CHLORIDE (POUR BTL) OPTIME
TOPICAL | Status: DC | PRN
  Administered 2023-03-07: 2000 mL

## 2023-03-07 MED ORDER — IBUPROFEN-DIPHENHYDRAMINE HCL 200-25 MG PO CAPS: 2.0000 | ORAL_CAPSULE | Freq: Every evening | ORAL | Status: DC | PRN

## 2023-03-07 MED ORDER — LIDOCAINE 2% (20 MG/ML) 5 ML SYRINGE
INTRAMUSCULAR | Status: AC
Start: 2023-03-07 — End: ?
  Filled 2023-03-07: qty 5

## 2023-03-07 MED ORDER — LORATADINE 10 MG PO TABS
10.0000 mg | ORAL_TABLET | Freq: Every day | ORAL | Status: DC
  Administered 2023-03-07 – 2023-03-08 (×2): 10 mg via ORAL
  Filled 2023-03-07 (×2): qty 1

## 2023-03-07 MED ORDER — ONDANSETRON HCL 4 MG/2ML IJ SOLN
INTRAMUSCULAR | Status: DC | PRN
  Administered 2023-03-07: 4 mg via INTRAVENOUS

## 2023-03-07 MED ORDER — LIDOCAINE-EPINEPHRINE 1 %-1:100000 IJ SOLN
INTRAMUSCULAR | Status: AC
  Filled 2023-03-07: qty 1

## 2023-03-07 MED ORDER — ROCURONIUM BROMIDE 10 MG/ML (PF) SYRINGE
PREFILLED_SYRINGE | INTRAVENOUS | Status: AC
  Filled 2023-03-07: qty 10

## 2023-03-07 MED ORDER — CEFAZOLIN SODIUM-DEXTROSE 2-4 GM/100ML-% IV SOLN
2.0000 g | Freq: Three times a day (TID) | INTRAVENOUS | Status: DC
  Administered 2023-03-07 – 2023-03-08 (×3): 2 g via INTRAVENOUS
  Filled 2023-03-07 (×3): qty 100

## 2023-03-07 MED ORDER — ONDANSETRON HCL 4 MG/2ML IJ SOLN
INTRAMUSCULAR | Status: AC
  Filled 2023-03-07: qty 2

## 2023-03-07 MED ORDER — LACTATED RINGERS IV SOLN
INTRAVENOUS | Status: DC | PRN
Start: 2023-03-07 — End: 2023-03-07

## 2023-03-07 MED ORDER — CHLORHEXIDINE GLUCONATE CLOTH 2 % EX PADS: 6.0000 | MEDICATED_PAD | Freq: Once | CUTANEOUS | Status: DC

## 2023-03-07 MED ORDER — ACETAMINOPHEN 500 MG PO TABS: 1000.0000 mg | ORAL_TABLET | Freq: Every evening | ORAL | Status: DC | PRN

## 2023-03-07 MED ORDER — CEFAZOLIN SODIUM-DEXTROSE 2-4 GM/100ML-% IV SOLN
2.0000 g | INTRAVENOUS | Status: AC
  Administered 2023-03-07: 2 g via INTRAVENOUS
  Filled 2023-03-07: qty 100

## 2023-03-07 MED ORDER — ACETAMINOPHEN 500 MG PO TABS: 1000.0000 mg | ORAL_TABLET | Freq: Four times a day (QID) | ORAL | Status: DC | PRN

## 2023-03-07 MED ORDER — PROPOFOL 10 MG/ML IV BOLUS
INTRAVENOUS | Status: DC | PRN
  Administered 2023-03-07: 200 mg via INTRAVENOUS

## 2023-03-07 MED ORDER — ACETAMINOPHEN 500 MG PO TABS
1000.0000 mg | ORAL_TABLET | Freq: Once | ORAL | Status: AC
  Administered 2023-03-07: 1000 mg via ORAL
  Filled 2023-03-07: qty 2

## 2023-03-07 MED ORDER — CHLORHEXIDINE GLUCONATE 0.12 % MT SOLN
15.0000 mL | Freq: Once | OROMUCOSAL | Status: AC
  Administered 2023-03-07: 15 mL via OROMUCOSAL
  Filled 2023-03-07: qty 15

## 2023-03-07 MED ORDER — MIDAZOLAM HCL 2 MG/2ML IJ SOLN
INTRAMUSCULAR | Status: AC
  Filled 2023-03-07: qty 2

## 2023-03-07 MED ORDER — BUPIVACAINE HCL (PF) 0.25 % IJ SOLN
INTRAMUSCULAR | Status: AC
  Filled 2023-03-07: qty 30

## 2023-03-07 MED ORDER — THROMBIN 20000 UNITS EX SOLR: CUTANEOUS | Status: DC | PRN

## 2023-03-07 MED ORDER — LIDOCAINE-EPINEPHRINE 1 %-1:100000 IJ SOLN
INTRAMUSCULAR | Status: DC | PRN
  Administered 2023-03-07: 10 mL

## 2023-03-07 MED ORDER — FAMOTIDINE 20 MG PO TABS
40.0000 mg | ORAL_TABLET | Freq: Every evening | ORAL | Status: DC
  Administered 2023-03-07: 40 mg via ORAL
  Filled 2023-03-07: qty 2

## 2023-03-07 MED ORDER — PROPOFOL 1000 MG/100ML IV EMUL
INTRAVENOUS | Status: AC
  Filled 2023-03-07: qty 100

## 2023-03-07 MED ORDER — ORAL CARE MOUTH RINSE: 15.0000 mL | Freq: Once | OROMUCOSAL | Status: AC

## 2023-03-07 MED ORDER — ROCURONIUM BROMIDE 10 MG/ML (PF) SYRINGE
PREFILLED_SYRINGE | INTRAVENOUS | Status: DC | PRN
  Administered 2023-03-07: 20 mg via INTRAVENOUS
  Administered 2023-03-07: 60 mg via INTRAVENOUS
  Administered 2023-03-07 (×3): 20 mg via INTRAVENOUS

## 2023-03-07 MED ORDER — THROMBIN 5000 UNITS EX SOLR
CUTANEOUS | Status: AC
  Filled 2023-03-07: qty 5000

## 2023-03-07 MED ORDER — PROPOFOL 10 MG/ML IV BOLUS
INTRAVENOUS | Status: AC
  Filled 2023-03-07: qty 20

## 2023-03-07 MED ORDER — ACETAMINOPHEN 325 MG PO TABS: 650.0000 mg | ORAL_TABLET | ORAL | Status: DC | PRN

## 2023-03-07 MED ORDER — SODIUM CHLORIDE 0.9% FLUSH
3.0000 mL | Freq: Two times a day (BID) | INTRAVENOUS | Status: DC
  Administered 2023-03-07 – 2023-03-08 (×3): 3 mL via INTRAVENOUS

## 2023-03-07 MED ORDER — TRAMADOL HCL 50 MG PO TABS: 50.0000 mg | ORAL_TABLET | Freq: Four times a day (QID) | ORAL | Status: DC | PRN

## 2023-03-07 MED ORDER — HYDROCODONE-ACETAMINOPHEN 5-325 MG PO TABS
2.0000 | ORAL_TABLET | ORAL | Status: DC | PRN
Start: 2023-03-07 — End: 2023-03-08
  Administered 2023-03-07 – 2023-03-08 (×5): 2 via ORAL
  Filled 2023-03-07 (×5): qty 2

## 2023-03-07 MED ORDER — SUGAMMADEX SODIUM 200 MG/2ML IV SOLN
INTRAVENOUS | Status: DC | PRN
  Administered 2023-03-07: 200 mg via INTRAVENOUS

## 2023-03-07 MED ORDER — LIDOCAINE 2% (20 MG/ML) 5 ML SYRINGE
INTRAMUSCULAR | Status: DC | PRN
  Administered 2023-03-07: 100 mg via INTRAVENOUS

## 2023-03-07 MED ORDER — MENTHOL 3 MG MT LOZG: 1.0000 | LOZENGE | OROMUCOSAL | Status: DC | PRN

## 2023-03-07 MED ORDER — PANTOPRAZOLE SODIUM 40 MG IV SOLR: 40.0000 mg | Freq: Every day | INTRAVENOUS | Status: DC

## 2023-03-07 MED ORDER — DIPHENHYDRAMINE-APAP (SLEEP) 25-500 MG PO TABS: 2.0000 | ORAL_TABLET | Freq: Every evening | ORAL | Status: DC | PRN

## 2023-03-07 MED ORDER — ACETAMINOPHEN 650 MG RE SUPP: 650.0000 mg | RECTAL | Status: DC | PRN

## 2023-03-07 MED ORDER — PANTOPRAZOLE SODIUM 40 MG PO TBEC
40.0000 mg | DELAYED_RELEASE_TABLET | Freq: Every day | ORAL | Status: DC
  Administered 2023-03-07: 40 mg via ORAL
  Filled 2023-03-07: qty 1

## 2023-03-07 MED ORDER — DEXAMETHASONE SODIUM PHOSPHATE 10 MG/ML IJ SOLN
INTRAMUSCULAR | Status: DC | PRN
  Administered 2023-03-07: 10 mg via INTRAVENOUS

## 2023-03-07 MED ORDER — ACETAMINOPHEN 500 MG PO TABS: 1000.0000 mg | ORAL_TABLET | Freq: Two times a day (BID) | ORAL | Status: DC | PRN

## 2023-03-07 MED ORDER — PHENYLEPHRINE HCL-NACL 20-0.9 MG/250ML-% IV SOLN
INTRAVENOUS | Status: DC | PRN
  Administered 2023-03-07: 20 ug/min via INTRAVENOUS

## 2023-03-07 MED ORDER — BUPIVACAINE LIPOSOME 1.3 % IJ SUSP
INTRAMUSCULAR | Status: AC
  Filled 2023-03-07: qty 20

## 2023-03-07 MED ORDER — MIDAZOLAM HCL 2 MG/2ML IJ SOLN
INTRAMUSCULAR | Status: DC | PRN
  Administered 2023-03-07: 2 mg via INTRAVENOUS

## 2023-03-07 MED ORDER — ONDANSETRON HCL 4 MG PO TABS: 4.0000 mg | ORAL_TABLET | Freq: Four times a day (QID) | ORAL | Status: DC | PRN

## 2023-03-07 MED ORDER — IBUPROFEN 400 MG PO TABS: 400.0000 mg | ORAL_TABLET | Freq: Every evening | ORAL | Status: DC | PRN

## 2023-03-07 MED ORDER — BUPIVACAINE LIPOSOME 1.3 % IJ SUSP
INTRAMUSCULAR | Status: DC | PRN
  Administered 2023-03-07: 20 mL

## 2023-03-07 SURGICAL SUPPLY — 66 items
BAG COUNTER SPONGE SURGICOUNT (BAG) ×1 IMPLANT
BASKET BONE COLLECTION (BASKET) ×1 IMPLANT
BENZOIN TINCTURE PRP APPL 2/3 (GAUZE/BANDAGES/DRESSINGS) ×1 IMPLANT
BIT DRILL PLIF MAS 5.0MM DISP (DRILL) IMPLANT
BLADE BONE MILL MEDIUM (MISCELLANEOUS) ×1 IMPLANT
BLADE CLIPPER SURG (BLADE) IMPLANT
BLADE SURG 11 STRL SS (BLADE) ×1 IMPLANT
BONE VIVIGEN FORMABLE 5.4CC (Bone Implant) ×1 IMPLANT
BUR CUTTER 7.0 ROUND (BURR) ×1 IMPLANT
BUR MATCHSTICK NEURO 3.0 LAGG (BURR) ×1 IMPLANT
CANISTER SUCT 3000ML PPV (MISCELLANEOUS) ×1 IMPLANT
CNTNR URN SCR LID CUP LEK RST (MISCELLANEOUS) ×1 IMPLANT
COVER BACK TABLE 60X90IN (DRAPES) ×1 IMPLANT
DERMABOND ADVANCED .7 DNX12 (GAUZE/BANDAGES/DRESSINGS) ×1 IMPLANT
DRAPE C-ARM 42X72 X-RAY (DRAPES) ×2 IMPLANT
DRAPE C-ARMOR (DRAPES) IMPLANT
DRAPE HALF SHEET 40X57 (DRAPES) IMPLANT
DRAPE LAPAROTOMY 100X72X124 (DRAPES) ×1 IMPLANT
DRAPE SURG 17X23 STRL (DRAPES) ×1 IMPLANT
DRILL PLIF MAS 5.0MM DISP (DRILL) ×1
DRSG OPSITE 4X5.5 SM (GAUZE/BANDAGES/DRESSINGS) ×1 IMPLANT
DRSG OPSITE POSTOP 4X6 (GAUZE/BANDAGES/DRESSINGS) ×1 IMPLANT
DURAPREP 26ML APPLICATOR (WOUND CARE) ×1 IMPLANT
ELECT REM PT RETURN 9FT ADLT (ELECTROSURGICAL) ×1
ELECTRODE REM PT RTRN 9FT ADLT (ELECTROSURGICAL) ×1 IMPLANT
EVACUATOR 3/16 PVC DRAIN (DRAIN) ×1 IMPLANT
GAUZE 4X4 16PLY ~~LOC~~+RFID DBL (SPONGE) IMPLANT
GAUZE SPONGE 4X4 12PLY STRL (GAUZE/BANDAGES/DRESSINGS) ×1 IMPLANT
GLOVE BIO SURGEON STRL SZ7 (GLOVE) IMPLANT
GLOVE BIO SURGEON STRL SZ8 (GLOVE) ×2 IMPLANT
GLOVE BIOGEL PI IND STRL 7.0 (GLOVE) IMPLANT
GLOVE EXAM NITRILE XL STR (GLOVE) IMPLANT
GLOVE INDICATOR 8.5 STRL (GLOVE) ×4 IMPLANT
GOWN STRL REUS W/ TWL LRG LVL3 (GOWN DISPOSABLE) IMPLANT
GOWN STRL REUS W/ TWL XL LVL3 (GOWN DISPOSABLE) ×2 IMPLANT
GOWN STRL REUS W/TWL 2XL LVL3 (GOWN DISPOSABLE) IMPLANT
GRAFT BNE MATRIX VG FRMBL MD 5 (Bone Implant) IMPLANT
KIT BASIN OR (CUSTOM PROCEDURE TRAY) ×1 IMPLANT
KIT TURNOVER KIT B (KITS) ×1 IMPLANT
MILL BONE PREP (MISCELLANEOUS) ×1 IMPLANT
NDL HYPO 21X1.5 SAFETY (NEEDLE) ×1 IMPLANT
NDL HYPO 25X1 1.5 SAFETY (NEEDLE) ×1 IMPLANT
NEEDLE HYPO 21X1.5 SAFETY (NEEDLE) ×1
NEEDLE HYPO 25X1 1.5 SAFETY (NEEDLE) ×1
NS IRRIG 1000ML POUR BTL (IV SOLUTION) ×1 IMPLANT
PACK LAMINECTOMY NEURO (CUSTOM PROCEDURE TRAY) ×1 IMPLANT
PAD ARMBOARD 7.5X6 YLW CONV (MISCELLANEOUS) ×3 IMPLANT
ROD PLIF MAS 65MM (Rod) IMPLANT
ROD SPNL 70XPREBNT NS MAS (Rod) IMPLANT
SCREW LOCK FXNS SPNE MAS PL (Screw) IMPLANT
SCREW SHANK 5.5X30MM (Screw) IMPLANT
SCREW SHANKS 5.5X35 (Screw) IMPLANT
SCREW TULIP 5.5 (Screw) IMPLANT
SPACER SUSTAIN RT TI 8X22X11 8 (Spacer) IMPLANT
SPIKE FLUID TRANSFER (MISCELLANEOUS) ×1 IMPLANT
SPONGE SURGIFOAM ABS GEL 100 (HEMOSTASIS) ×1 IMPLANT
SPONGE T-LAP 4X18 ~~LOC~~+RFID (SPONGE) IMPLANT
STRIP CLOSURE SKIN 1/2X4 (GAUZE/BANDAGES/DRESSINGS) ×2 IMPLANT
SUT VIC AB 0 CT1 18XCR BRD8 (SUTURE) ×2 IMPLANT
SUT VIC AB 2-0 CT1 18 (SUTURE) ×1 IMPLANT
SUT VIC AB 4-0 PS2 27 (SUTURE) ×1 IMPLANT
SYR 20ML LL LF (SYRINGE) IMPLANT
TOWEL GREEN STERILE (TOWEL DISPOSABLE) ×1 IMPLANT
TOWEL GREEN STERILE FF (TOWEL DISPOSABLE) ×1 IMPLANT
TRAY FOLEY MTR SLVR 16FR STAT (SET/KITS/TRAYS/PACK) ×1 IMPLANT
WATER STERILE IRR 1000ML POUR (IV SOLUTION) ×1 IMPLANT

## 2023-03-07 NOTE — Plan of Care (Signed)

## 2023-03-07 NOTE — Anesthesia Procedure Notes (Signed)
Procedure Name: Intubation Date/Time: 03/07/2023 9:20 AM  Performed by: Pincus Large, CRNAPre-anesthesia Checklist: Patient identified, Emergency Drugs available, Suction available and Patient being monitored Patient Re-evaluated:Patient Re-evaluated prior to induction Oxygen Delivery Method: Circle System Utilized Preoxygenation: Pre-oxygenation with 100% oxygen Induction Type: IV induction Ventilation: Mask ventilation without difficulty Laryngoscope Size: Mac and 4 Grade View: Grade II Tube type: Oral Tube size: 8.0 mm Number of attempts: 1 Airway Equipment and Method: Stylet and Oral airway Placement Confirmation: ETT inserted through vocal cords under direct vision, positive ETCO2 and breath sounds checked- equal and bilateral Secured at: 23 cm Tube secured with: Tape Dental Injury: Teeth and Oropharynx as per pre-operative assessment

## 2023-03-07 NOTE — Anesthesia Postprocedure Evaluation (Signed)
Anesthesia Post Note  Patient: Cody Park  Procedure(s) Performed: Posterior Lumbar Interbody Fusion Lumbar Five to Sacral One Extension of Fusion Removal of Hardware Lumbar Four-Lumbar Five Interbody Fusion (Back)     Patient location during evaluation: PACU Anesthesia Type: General Level of consciousness: awake and alert Pain management: pain level controlled Vital Signs Assessment: post-procedure vital signs reviewed and stable Respiratory status: spontaneous breathing, nonlabored ventilation and respiratory function stable Cardiovascular status: blood pressure returned to baseline and stable Postop Assessment: no apparent nausea or vomiting Anesthetic complications: no  No notable events documented.  Last Vitals:  Vitals:   03/07/23 1400 03/07/23 1438  BP: (!) 150/80 (!) 153/77  Pulse: 81 74  Resp: 20 20  Temp: 36.6 C 36.6 C  SpO2: 95% 98%    Last Pain:  Vitals:   03/07/23 1400  TempSrc:   PainSc: 0-No pain                 Raidyn Breiner,W. EDMOND

## 2023-03-07 NOTE — H&P (Signed)
MOE YONAMINE is an 62 y.o. male.   Chief Complaint: Back and left leg pain HPI: 62 year old gentleman previously undergone L4-5 fusion did very well however the last several months and years noted progressive worsening back and bilateral hip and leg pain worse on the left.  Workup has revealed progressive degeneration degeneration at L5-S1 below his previous fusion.  And due to the patient's progression of clinical syndrome imaging findings of a conservative treatment I recommended decompressive laminectomy interbody fusion at L5-S1 removal hardware L4-5.  Extensively reviewed the risks and benefits of the operation with him as well as perioperative course expectations of outcome and alternatives to surgery and he understood and agreed to proceed forward.  Past Medical History:  Diagnosis Date   Arthritis    Asthma    as a child   GERD (gastroesophageal reflux disease)    Hypercholesterolemia    Hypertension    Neuromuscular disorder (HCC)    Numbness and tingling of right leg    Seizures (HCC)    last one was 1991    Past Surgical History:  Procedure Laterality Date   COLONOSCOPY     MAXIMUM ACCESS (MAS)POSTERIOR LUMBAR INTERBODY FUSION (PLIF) 1 LEVEL N/A 11/04/2013   Procedure: FOR MAXIMUM ACCESS (MAS) POSTERIOR LUMBAR INTERBODY FUSION (PLIF) 1 LEVEL;  Surgeon: Maeola Harman, MD;  Location: [facility];  Service: Neurosurgery;  Laterality: N/A;  L4-5 maximum access posterior lumbar interbody fusion    Family History  Problem Relation Age of Onset   Hypertension Other    Social History:  reports that he has quit smoking. His smoking use included cigars. He has never used smokeless tobacco. He reports current alcohol use. He reports that he does not use drugs.  Allergies:  Allergies  Allergen Reactions   Codeine Other (See Comments)    Childhood allergy, reaction unknown.   Shellfish Allergy Other (See Comments)    AGENT:  Only some shellfish.   REACTION: swelling     Medications Prior to Admission  Medication Sig Dispense Refill   acetaminophen (TYLENOL) 500 MG tablet Take 1,000 mg by mouth 2 (two) times daily as needed for mild pain.     carbamazepine (TEGRETOL) 200 MG tablet Take 300 mg by mouth 2 (two) times daily.     cyclobenzaprine (FLEXERIL) 10 MG tablet Take 1 tablet (10 mg total) by mouth 3 (three) times daily as needed for muscle spasms. (Patient taking differently: Take 10 mg by mouth at bedtime as needed for muscle spasms.) 90 tablet 0   diphenhydramine-acetaminophen (TYLENOL PM) 25-500 MG TABS tablet Take 2 tablets by mouth at bedtime as needed (sleep).     docusate sodium (COLACE) 100 MG capsule Take 100 mg by mouth every morning.     enalapril (VASOTEC) 10 MG tablet Take 10 mg by mouth daily.     famotidine (PEPCID) 40 MG tablet Take 40 mg by mouth every evening.     ibuprofen (ADVIL,MOTRIN) 200 MG tablet Take 400 mg by mouth 2 (two) times daily as needed for mild pain.     Ibuprofen-diphenhydrAMINE HCl (IBUPROFEN PM) 200-25 MG CAPS Take 2 tablets by mouth at bedtime as needed (sleep).     ipratropium (ATROVENT) 0.06 % nasal spray Place 2 sprays into both nostrils 2 (two) times daily.     levocetirizine (XYZAL) 5 MG tablet Take 5 mg by mouth daily.     naproxen sodium (ALEVE) 220 MG tablet Take 220 mg by mouth daily as needed (pain).  Omega-3 Fatty Acids (FISH OIL) 1000 MG CAPS Take 1,000 mg by mouth daily.     pravastatin (PRAVACHOL) 20 MG tablet Take 20 mg by mouth daily.     traMADol (ULTRAM) 50 MG tablet Take 50 mg by mouth every 6 (six) hours as needed.      Results for orders placed or performed during the hospital encounter of 03/07/23 (from the past 48 hour(s))  Basic metabolic panel     Status: Abnormal   Collection Time: 03/07/23  6:44 AM  Result Value Ref Range   Sodium 138 135 - 145 mmol/L   Potassium 3.5 3.5 - 5.1 mmol/L   Chloride 105 98 - 111 mmol/L   CO2 21 (L) 22 - 32 mmol/L   Glucose, Bld 129 (H) 70 - 99 mg/dL     Comment: Glucose reference range applies only to samples taken after fasting for at least 8 hours.   BUN 19 6 - 20 mg/dL   Creatinine, Ser 1.61 0.61 - 1.24 mg/dL   Calcium 8.9 8.9 - 09.6 mg/dL   GFR, Estimated >04 >54 mL/min    Comment: (NOTE) Calculated using the CKD-EPI Creatinine Equation (2021)    Anion gap 12 5 - 15    Comment: Performed at [facility], [address], [place], [place] [address]   No results found.  Review of Systems  Musculoskeletal:  Positive for back pain.  Neurological:  Positive for numbness.    Blood pressure (!) 145/71, pulse 75, temperature 99 F (37.2 C), temperature source Oral, resp. rate 18, height 5\' 9"  (1.753 m), weight 108.9 kg, SpO2 95%. Physical Exam HENT:     Head: Normocephalic.     Right Ear: Tympanic membrane normal.     Nose: Nose normal.     Mouth/Throat:     Mouth: Mucous membranes are moist.  Eyes:     Pupils: Pupils are equal, round, and reactive to light.  Cardiovascular:     Rate and Rhythm: Normal rate.     Pulses: Normal pulses.  Pulmonary:     Effort: Pulmonary effort is normal.  Abdominal:     General: Abdomen is flat.  Musculoskeletal:        General: Normal range of motion.  Neurological:     Mental Status: He is alert.     Comments: Strength is 5 out of 5 iliopsoas, quads, hamstrings, gastroc, into tibialis, and EHL.      Assessment/Plan 62 year old presents for L5-S1 posterior lumbar interbody fusion  Mariam Dollar, MD 03/07/2023, 8:17 AM

## 2023-03-07 NOTE — Transfer of Care (Signed)
Immediate Anesthesia Transfer of Care Note  Patient: Cody Park  Procedure(s) Performed: Posterior Lumbar Interbody Fusion Lumbar Five to Sacral One Extension of Fusion Removal of Hardware Lumbar Four-Lumbar Five Interbody Fusion (Back)  Patient Location: PACU  Anesthesia Type:General  Level of Consciousness: awake, sedated, and drowsy  Airway & Oxygen Therapy: Patient Spontanous Breathing and Patient connected to face mask oxygen  Post-op Assessment: Report given to RN and Post -op Vital signs reviewed and stable  Post vital signs: Reviewed and stable  Last Vitals:  Vitals Value Taken Time  BP 128/73 03/07/23 1243  Temp 97.3   Pulse 71 03/07/23 1245  Resp 11 03/07/23 1245  SpO2 97 % 03/07/23 1245  Vitals shown include unfiled device data.  Last Pain:  Vitals:   03/07/23 0724  TempSrc:   PainSc: 0-No pain         Complications: No notable events documented.

## 2023-03-07 NOTE — Op Note (Signed)
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  Postoperative diagnosis: Same.  Procedure: #1 decompressive lumbar laminectomy L5-S1 with complete medial facetectomies redo foraminotomies of the L5 nerve root with foraminotomies of the S1 nerve root in excess and requiring more work than would be needed with a standard interbody fusion.  2.  Posterior lumbar fusion utilizing the globus insert and rotate titanium cages packed with locally harvested autograft mixed with Vivigen.  3.  Cortical screw fixation utilizing the NuVasive mass cortical screw set with new screws placed at S1 and utilizing old screws previously placed at L4-5.  4.  Expiration fusion move of hardware L4-5 with removal of bilateral knots and bilateral rods.  Surgeon: Donalee Citrin.  Assistant: Julien Girt.  Anesthesia: General.  EBL: Minimal.  HPI: 62 year old gentleman progressive worsening back and left greater than right leg pain workup revealed progressive spondylosis and foraminal stenosis to the L5 and S1 nerve roots with degenerative collapse.  Due to his failed conservative treatment imaging findings and progression of clinical syndrome I recommended exploration fusion move of hardware and extension with a new decompression and fusion at L5-S1.  I extensively reviewed the risks and benefits of the operation with the patient as well as perioperative course expectations of outcome and alternatives to surgery and he understood and agreed to proceed forward.  Operative procedure: Patient was brought into the OR was induced under general anesthesia his old incision was identified and after his back was prepped and draped in routine sterile fashion this incision was infiltrated with 10 cc lidocaine with epi and extended caudally scar tissue was dissected free and subperiosteal dissection was carried out the lamina of L5 and S1 exposing the  hardware at L4-5.  Fusion at L4-5 appeared to be solid attention was first taken to the decompression I remove the spinous process at L5 drilled down the facet joints performed a central decompression.  Then performed complete medial facetectomies with redo foraminotomies of the L5 nerve roots bilaterally was an extensive scar tissue I had to free up the L5 nerve off of the L5 pedicle and marched this out went through the pars and skeletonized the L5 nerve root all the way beyond the facet joint.  Then performed foraminotomies of the S1 nerve root and aggressively under bit the superior tickling facet.  This space was then identified and was partially calcified so I had to remove some posterior calcification to gain access to the disc base and opened up the disc base and distracted and selected 8-11 cages after adequate discectomy and endplate preparation and removing large amount of central calcified disc as well as performing a near complete discectomy I then inserted the cages bilaterally with an extensive mount of autograft mix centrally.  Wound was then copiously irrigated meticulous hemostasis was maintained and after all the interbody work was done I placed 2 new S1 cortical screws both screws had excellent purchase I then assembled the heads remove the knots and and rods and then connected up with extended rod from L4-S1 anchored everything in.  In place and compressed L5 against S1 then inspected the foramina to confirm patency and no migration of graft material and foraminotomies were widely patent.  Then Gelfoam was ON top of the dura a medium Hemovac drain was placed Exparel was injected in the fascia and the wound was closed in layers with interrupted Vicryl and a running 4 subcuticular.  Dermabond benzoin Steri-Strips and a  sterile dressing was applied patient recovery in stable condition.  At the end of the case all needle count sponge counts were correct.

## 2023-03-08 ENCOUNTER — Other Ambulatory Visit (HOSPITAL_COMMUNITY): Payer: Self-pay

## 2023-03-08 DIAGNOSIS — M48061 Spinal stenosis, lumbar region without neurogenic claudication: Secondary | ICD-10-CM | POA: Diagnosis not present

## 2023-03-08 MED ORDER — HYDROCODONE-ACETAMINOPHEN 5-325 MG PO TABS
2.0000 | ORAL_TABLET | ORAL | 0 refills | Status: AC | PRN
  Filled 2023-03-08: qty 30, 3d supply, fill #0

## 2023-03-08 NOTE — Discharge Instructions (Signed)
 Wound Care Keep incision covered and dry until post op day 3. You may remove the Honeycomb dressing on post op day 3. Leave steri-strips on back.  They will fall off by themselves. Do not put any creams, lotions, or ointments on incision. You are fine to shower. Let water run over incision and pat dry.   Activity Walk each and every day, increasing distance each day. No lifting greater than 8 lbs.  No lifting no bending no twisting no driving or riding a car unless coming back and forth to see the doctor. If provided with back brace, wear when out of bed.  It is not necessary to wear brace in bed.  Diet Resume your normal diet.   Return to Work Will be discussed at your follow up appointment.  Call Your Doctor If Any of These Occur Redness, drainage, or swelling at the wound.  Temperature greater than 101 degrees. Severe pain not relieved by pain medication. Incision starts to come apart.  Follow Up Appt Call [phone] if you have one or any problem.

## 2023-03-08 NOTE — Evaluation (Signed)
Occupational Therapy Evaluation and Discharge Patient Details Name: Cody Park MRN: [id number] DOB: Sep 29, 1960 Today's Date: 03/08/2023   History of Present Illness Decompressive lumbar laminectomy L5-S1 with complete medial facetectomies redo foraminotomies of the L5 nerve root with foraminotomies of the S1 nerve root and fusion. PHMx: OA, HTN, neuromuscular disorder, seizures, 1 level lumbar fusion.   Clinical Impression   This 62 yo male admitted and underwent above presents to acute OT with all education and handout review completed with with pt and family. No further OT needs, we will D/C from acute OT.       If plan is discharge home, recommend the following: Assistance with cooking/housework;Assist for transportation;A little help with bathing/dressing/bathroom    Functional Status Assessment  Patient has had a recent decline in their functional status and demonstrates the ability to make significant improvements in function in a reasonable and predictable amount of time. (without further skilled OT services)  Equipment Recommendations  None recommended by OT       Precautions / Restrictions Precautions Precautions: Back Precaution Booklet Issued: Yes (comment) Required Braces or Orthoses: Spinal Brace Spinal Brace: Lumbar corset;Applied in sitting position;Applied in standing position Restrictions Weight Bearing Restrictions: No      Mobility Bed Mobility               General bed mobility comments: Pt sititng on EOB, Verbalized understanding of steps to get in and out of bed        Balance Overall balance assessment: Mild deficits observed, not formally tested                                         ADL either performed or assessed with clinical judgement   ADL                                         General ADL Comments: Educated pt and wife on all the information from post op back surgery therapy handout, pt and  wife without questions. Did add the sit<>stand stance that helps keep the back more straight.     Vision Patient Visual Report: No change from baseline              Pertinent Vitals/Pain Pain Assessment Pain Assessment: Faces Faces Pain Scale: Hurts little more Pain Location: incisional Pain Descriptors / Indicators: Aching, Sore Pain Intervention(s): Limited activity within patient's tolerance, Monitored during session     Extremity/Trunk Assessment Upper Extremity Assessment Upper Extremity Assessment: Overall WFL for tasks assessed           Communication Communication Communication: No apparent difficulties   Cognition Arousal: Alert Behavior During Therapy: WFL for tasks assessed/performed Overall Cognitive Status: Within Functional Limits for tasks assessed                                                  Home Living Family/patient expects to be discharged to:: Private residence Living Arrangements: Spouse/significant other;Children Available Help at Discharge: Family;Available 24 hours/day Type of Home: House Home Access: Stairs to enter [employer] of Steps: 1   Home Layout: One level     Bathroom Shower/Tub: Walk-in  shower   Bathroom Toilet: Handicapped height     Home Equipment: Shower seat - built in;Hand held shower head;Grab bars - toilet (adjustable bed)   Additional Comments: former PTA, 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, 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 Prior Level of Function : Independent/Modified Independent;Driving                        OT Problem List: Decreased range of motion;Pain         OT Goals(Current goals can be found in the care plan section) Acute Rehab OT Goals Patient Stated Goal: to go home today         AM-PAC OT "6 Clicks" Daily Activity     Outcome Measure Help from another person eating meals?: None Help from another person taking care of personal grooming?: None Help from  another person toileting, which includes using toliet, bedpan, or urinal?: None Help from another person bathing (including washing, rinsing, drying)?: None Help from another person to put on and taking off regular upper body clothing?: None Help from another person to put on and taking off regular lower body clothing?: None 6 Click Score: 24   End of Session Nurse Communication:  (no further OT needs)  Activity Tolerance: Patient tolerated treatment well Patient left:  (sitting EOB with wife A him with getting dressed)  OT Visit Diagnosis: Other abnormalities of gait and mobility (R26.89);Pain Pain - part of body:  (incisional)                Time: 2952-8413 OT Time Calculation (min): 15 min Charges:  OT General Charges $OT Visit: 1 Visit OT Evaluation $OT Eval Moderate Complexity: 1 Mod  Cathy L. OT Acute Rehabilitation Services Office [phone]    Evette Georges 03/08/2023, 9:15 AM

## 2023-03-08 NOTE — Plan of Care (Signed)

## 2023-03-08 NOTE — Progress Notes (Signed)
Patient alert and oriented, mae's well, voiding adequate amount of urine, swallowing without difficulty, no c/o pain at time of discharge. Patient discharged home with wife. Script and discharged instructions given to patient. Patient and family stated understanding of instructions given. Patient has an appointment with Dr. Wynetta Emery

## 2023-03-08 NOTE — Discharge Summary (Signed)
Physician Discharge Summary  Patient ID: Cody Park MRN: [id number] DOB/AGE: December 25, 1960 62 y.o. Estimated body mass index is 35.44 kg/m as calculated from the following:   Height as of this encounter: 5\' 9"  (1.753 m).   Weight as of this encounter: 108.9 kg.   Admit date: 03/07/2023 Discharge date: 03/08/2023  Admission Diagnoses: Lumbar spondylosis with stenosis L5-S1  Discharge Diagnoses: Same Principal Problem:   Spinal stenosis of lumbosacral region   Discharged Condition: good  Hospital Course: Patient was admitted to the hospital underwent decompressive laminectomy interbody fusion L5-S1 postoperative patient did very well with covering the floor on the floor was ambulating and voiding spontaneously tolerating regular diet and stable for discharge home.  Patient be discharged scheduled follow-up in 1 to 2 weeks.  Consults: Significant Diagnostic Studies: Treatments: Posterior lumbar interbody fusion L5-S1 Discharge Exam: Blood pressure 127/69, pulse 82, temperature 98.2 F (36.8 C), temperature source Oral, resp. rate 18, height 5\' 9"  (1.753 m), weight 108.9 kg, SpO2 98%. Strength 5 out of 5 wound clean dry and intact  Disposition: Home   Allergies as of 03/08/2023       Reactions   Codeine Other (See Comments)   Childhood allergy, reaction unknown.   Shellfish Allergy Other (See Comments)   AGENT:  Only some shellfish.   REACTION: swelling        Medication List     STOP taking these medications    ibuprofen 200 MG tablet Commonly known as: ADVIL   Ibuprofen PM 200-25 MG Caps Generic drug: Ibuprofen-diphenhydrAMINE HCl   naproxen sodium 220 MG tablet Commonly known as: ALEVE       TAKE these medications    acetaminophen 500 MG tablet Commonly known as: TYLENOL Take 1,000 mg by mouth 2 (two) times daily as needed for mild pain.   carbamazepine 200 MG tablet Commonly known as: TEGRETOL Take 300 mg by mouth 2 (two) times daily.    cyclobenzaprine 10 MG tablet Commonly known as: FLEXERIL Take 1 tablet (10 mg total) by mouth 3 (three) times daily as needed for muscle spasms. What changed: when to take this   diphenhydramine-acetaminophen 25-500 MG Tabs tablet Commonly known as: TYLENOL PM Take 2 tablets by mouth at bedtime as needed (sleep).   docusate sodium 100 MG capsule Commonly known as: COLACE Take 100 mg by mouth every morning.   enalapril 10 MG tablet Commonly known as: VASOTEC Take 10 mg by mouth daily.   famotidine 40 MG tablet Commonly known as: PEPCID Take 40 mg by mouth every evening.   Fish Oil 1000 MG Caps Take 1,000 mg by mouth daily.   HYDROcodone-acetaminophen 5-325 MG tablet Commonly known as: NORCO/VICODIN Take 2 tablets by mouth every 4 (four) hours as needed for severe pain (pain score 7-10).   ipratropium 0.06 % nasal spray Commonly known as: ATROVENT Place 2 sprays into both nostrils 2 (two) times daily.   levocetirizine 5 MG tablet Commonly known as: XYZAL Take 5 mg by mouth daily.   pravastatin 20 MG tablet Commonly known as: PRAVACHOL Take 20 mg by mouth daily.   traMADol 50 MG tablet Commonly known as: ULTRAM Take 50 mg by mouth every 6 (six) hours as needed.        Follow-up Information     Donalee Citrin, MD. Call.   Specialty: Neurosurgery Why: As needed, If symptoms worsen Contact information: [address] [place] [address] [phone]  Signed: Mariam Dollar 03/08/2023, 8:27 AM
